# Patient Record
Sex: Male | Born: 1950 | Race: Black or African American | Hispanic: No | Marital: Married | Smoking: Former smoker
Health system: Southern US, Community
[De-identification: ages and names within clinical notes are randomized; demographics above are authoritative.]

## PROBLEM LIST (undated history)

## (undated) DIAGNOSIS — K56609 Unspecified intestinal obstruction, unspecified as to partial versus complete obstruction: Secondary | ICD-10-CM

## (undated) DIAGNOSIS — C8590 Non-Hodgkin lymphoma, unspecified, unspecified site: Secondary | ICD-10-CM

## (undated) DIAGNOSIS — I714 Abdominal aortic aneurysm, without rupture, unspecified: Secondary | ICD-10-CM

## (undated) DIAGNOSIS — Z992 Dependence on renal dialysis: Secondary | ICD-10-CM

## (undated) DIAGNOSIS — I200 Unstable angina: Secondary | ICD-10-CM

## (undated) DIAGNOSIS — I2510 Atherosclerotic heart disease of native coronary artery without angina pectoris: Secondary | ICD-10-CM

## (undated) DIAGNOSIS — N186 End stage renal disease: Secondary | ICD-10-CM

## (undated) DIAGNOSIS — I10 Essential (primary) hypertension: Secondary | ICD-10-CM

## (undated) DIAGNOSIS — K219 Gastro-esophageal reflux disease without esophagitis: Secondary | ICD-10-CM

## (undated) HISTORY — PX: APPENDECTOMY: SHX54

## (undated) HISTORY — PX: RENAL ARTERY STENT: SHX2321

## (undated) HISTORY — PX: ENDARTERECTOMY: SHX5162

## (undated) HISTORY — PX: ANGIOPLASTY / STENTING FEMORAL: SUR30

## (undated) HISTORY — PX: CAROTID STENT: SHX1301

## (undated) HISTORY — PX: SP AV DIALYSIS SHUNT ACCESS EXISTING *L*: HXRAD383

## (undated) HISTORY — PX: KNEE SURGERY: SHX244

## (undated) HISTORY — PX: HERNIA REPAIR: SHX51

## (undated) HISTORY — PX: ROTATOR CUFF REPAIR: SHX139

## (undated) HISTORY — PX: CORONARY ARTERY BYPASS GRAFT: SHX141

---

## 2016-04-09 DIAGNOSIS — K56609 Unspecified intestinal obstruction, unspecified as to partial versus complete obstruction: Secondary | ICD-10-CM

## 2016-04-09 HISTORY — DX: Unspecified intestinal obstruction, unspecified as to partial versus complete obstruction: K56.609

## 2016-05-04 ENCOUNTER — Inpatient Hospital Stay (HOSPITAL_COMMUNITY)
Admission: AD | Admit: 2016-05-04 | Discharge: 2016-05-07 | DRG: 388 | Disposition: A | Discharge status: Home / Self Care | Payer: Medicare Other | Source: Other Acute Inpatient Hospital | Attending: Internal Medicine | Admitting: Internal Medicine

## 2016-05-04 ENCOUNTER — Inpatient Hospital Stay (HOSPITAL_COMMUNITY): Payer: Medicare Other

## 2016-05-04 ENCOUNTER — Encounter (HOSPITAL_COMMUNITY): Payer: Self-pay | Admitting: Family Medicine

## 2016-05-04 DIAGNOSIS — I2510 Atherosclerotic heart disease of native coronary artery without angina pectoris: Secondary | ICD-10-CM | POA: Diagnosis present

## 2016-05-04 DIAGNOSIS — Z79899 Other long term (current) drug therapy: Secondary | ICD-10-CM

## 2016-05-04 DIAGNOSIS — D649 Anemia, unspecified: Secondary | ICD-10-CM | POA: Diagnosis not present

## 2016-05-04 DIAGNOSIS — I120 Hypertensive chronic kidney disease with stage 5 chronic kidney disease or end stage renal disease: Secondary | ICD-10-CM | POA: Diagnosis present

## 2016-05-04 DIAGNOSIS — Z955 Presence of coronary angioplasty implant and graft: Secondary | ICD-10-CM

## 2016-05-04 DIAGNOSIS — Z8679 Personal history of other diseases of the circulatory system: Secondary | ICD-10-CM | POA: Diagnosis not present

## 2016-05-04 DIAGNOSIS — C8590 Non-Hodgkin lymphoma, unspecified, unspecified site: Secondary | ICD-10-CM | POA: Diagnosis not present

## 2016-05-04 DIAGNOSIS — K922 Gastrointestinal hemorrhage, unspecified: Secondary | ICD-10-CM | POA: Insufficient documentation

## 2016-05-04 DIAGNOSIS — Z66 Do not resuscitate: Secondary | ICD-10-CM | POA: Diagnosis not present

## 2016-05-04 DIAGNOSIS — I25110 Atherosclerotic heart disease of native coronary artery with unstable angina pectoris: Secondary | ICD-10-CM | POA: Diagnosis present

## 2016-05-04 DIAGNOSIS — Z951 Presence of aortocoronary bypass graft: Secondary | ICD-10-CM | POA: Diagnosis not present

## 2016-05-04 DIAGNOSIS — I25709 Atherosclerosis of coronary artery bypass graft(s), unspecified, with unspecified angina pectoris: Secondary | ICD-10-CM | POA: Diagnosis not present

## 2016-05-04 DIAGNOSIS — E860 Dehydration: Secondary | ICD-10-CM | POA: Diagnosis present

## 2016-05-04 DIAGNOSIS — I150 Renovascular hypertension: Secondary | ICD-10-CM | POA: Diagnosis not present

## 2016-05-04 DIAGNOSIS — K219 Gastro-esophageal reflux disease without esophagitis: Secondary | ICD-10-CM | POA: Diagnosis not present

## 2016-05-04 DIAGNOSIS — Z87891 Personal history of nicotine dependence: Secondary | ICD-10-CM

## 2016-05-04 DIAGNOSIS — I10 Essential (primary) hypertension: Secondary | ICD-10-CM | POA: Diagnosis not present

## 2016-05-04 DIAGNOSIS — Z992 Dependence on renal dialysis: Secondary | ICD-10-CM | POA: Diagnosis not present

## 2016-05-04 DIAGNOSIS — R9431 Abnormal electrocardiogram [ECG] [EKG]: Secondary | ICD-10-CM | POA: Diagnosis not present

## 2016-05-04 DIAGNOSIS — E889 Metabolic disorder, unspecified: Secondary | ICD-10-CM | POA: Diagnosis not present

## 2016-05-04 DIAGNOSIS — I25799 Atherosclerosis of other coronary artery bypass graft(s) with unspecified angina pectoris: Secondary | ICD-10-CM | POA: Diagnosis not present

## 2016-05-04 DIAGNOSIS — K56600 Partial intestinal obstruction, unspecified as to cause: Principal | ICD-10-CM | POA: Diagnosis present

## 2016-05-04 DIAGNOSIS — E785 Hyperlipidemia, unspecified: Secondary | ICD-10-CM | POA: Diagnosis not present

## 2016-05-04 DIAGNOSIS — N186 End stage renal disease: Secondary | ICD-10-CM

## 2016-05-04 DIAGNOSIS — I739 Peripheral vascular disease, unspecified: Secondary | ICD-10-CM | POA: Diagnosis not present

## 2016-05-04 DIAGNOSIS — Z9221 Personal history of antineoplastic chemotherapy: Secondary | ICD-10-CM

## 2016-05-04 DIAGNOSIS — K56609 Unspecified intestinal obstruction, unspecified as to partial versus complete obstruction: Secondary | ICD-10-CM | POA: Diagnosis present

## 2016-05-04 HISTORY — DX: Atherosclerotic heart disease of native coronary artery without angina pectoris: I25.10

## 2016-05-04 HISTORY — DX: Essential (primary) hypertension: I10

## 2016-05-04 HISTORY — DX: End stage renal disease: Z99.2

## 2016-05-04 HISTORY — DX: End stage renal disease: N18.6

## 2016-05-04 HISTORY — DX: Non-Hodgkin lymphoma, unspecified, unspecified site: C85.90

## 2016-05-04 HISTORY — DX: Dependence on renal dialysis: Z99.2

## 2016-05-04 HISTORY — DX: Unspecified intestinal obstruction, unspecified as to partial versus complete obstruction: K56.609

## 2016-05-04 HISTORY — DX: Gastro-esophageal reflux disease without esophagitis: K21.9

## 2016-05-04 LAB — COMPREHENSIVE METABOLIC PANEL
ALBUMIN: 3.9 g/dL (ref 3.5–5.0)
ALK PHOS: 102 U/L (ref 38–126)
ALT: 11 U/L — ABNORMAL LOW (ref 17–63)
ANION GAP: 23 — AB (ref 5–15)
AST: 23 U/L (ref 15–41)
BUN: 41 mg/dL — ABNORMAL HIGH (ref 6–20)
CALCIUM: 10 mg/dL (ref 8.9–10.3)
CHLORIDE: 95 mmol/L — AB (ref 101–111)
CO2: 21 mmol/L — AB (ref 22–32)
Creatinine, Ser: 10.59 mg/dL — ABNORMAL HIGH (ref 0.61–1.24)
GFR calc non Af Amer: 4 mL/min — ABNORMAL LOW (ref >60)
GFR, EST AFRICAN AMERICAN: 5 mL/min — AB (ref >60)
GLUCOSE: 95 mg/dL (ref 65–99)
POTASSIUM: 5.3 mmol/L — AB (ref 3.5–5.1)
SODIUM: 139 mmol/L (ref 135–145)
Total Bilirubin: 0.6 mg/dL (ref 0.3–1.2)
Total Protein: 7.3 g/dL (ref 6.5–8.1)

## 2016-05-04 LAB — CBC WITH DIFFERENTIAL/PLATELET
BASOS ABS: 0 10*3/uL (ref 0.0–0.1)
BASOS PCT: 0 %
Eosinophils Absolute: 0 10*3/uL (ref 0.0–0.7)
Eosinophils Relative: 0 %
HEMATOCRIT: 41.9 % (ref 39.0–52.0)
Hemoglobin: 13.6 g/dL (ref 13.0–17.0)
LYMPHS PCT: 20 %
Lymphs Abs: 1.6 10*3/uL (ref 0.7–4.0)
MCH: 33.5 pg (ref 26.0–34.0)
MCHC: 32.5 g/dL (ref 30.0–36.0)
MCV: 103.2 fL — AB (ref 78.0–100.0)
MONO ABS: 0.8 10*3/uL (ref 0.1–1.0)
Monocytes Relative: 10 %
NEUTROS ABS: 5.6 10*3/uL (ref 1.7–7.7)
NEUTROS PCT: 70 %
Platelets: 193 10*3/uL (ref 150–400)
RBC: 4.06 MIL/uL — AB (ref 4.22–5.81)
RDW: 15.7 % — AB (ref 11.5–15.5)
WBC: 8 10*3/uL (ref 4.0–10.5)

## 2016-05-04 LAB — PHOSPHORUS: PHOSPHORUS: 6 mg/dL — AB (ref 2.5–4.6)

## 2016-05-04 LAB — LACTIC ACID, PLASMA
Lactic Acid, Venous: 2.8 mmol/L (ref 0.5–1.9)
Lactic Acid, Venous: 1.6 mmol/L (ref 0.5–1.9)
Lactic Acid, Venous: 1.5 mmol/L (ref 0.5–1.9)

## 2016-05-04 LAB — TROPONIN I
TROPONIN I: 0.05 ng/mL — AB (ref <0.03)
Troponin I: 0.1 ng/mL (ref <0.03)
Troponin I: 0.04 ng/mL (ref <0.03)

## 2016-05-04 LAB — MAGNESIUM: MAGNESIUM: 2.6 mg/dL — AB (ref 1.7–2.4)

## 2016-05-04 LAB — PROTIME-INR
INR: 1.16
Prothrombin Time: 14.8 seconds (ref 11.4–15.2)

## 2016-05-04 MED ORDER — NITROGLYCERIN 0.4 MG SL SUBL
SUBLINGUAL_TABLET | SUBLINGUAL | Status: AC
  Filled 2016-05-04: qty 1

## 2016-05-04 MED ORDER — PROMETHAZINE HCL 25 MG/ML IJ SOLN: 12.5000 mg | Freq: Four times a day (QID) | INTRAMUSCULAR | Status: DC | PRN

## 2016-05-04 MED ORDER — SODIUM CHLORIDE 0.9 % IV SOLN
INTRAVENOUS | Status: DC
  Administered 2016-05-04: 14:00:00 via INTRAVENOUS

## 2016-05-04 MED ORDER — METOPROLOL TARTRATE 5 MG/5ML IV SOLN: 5.0000 mg | Freq: Three times a day (TID) | INTRAVENOUS | Status: DC | PRN

## 2016-05-04 MED ORDER — FENTANYL CITRATE (PF) 100 MCG/2ML IJ SOLN
50.0000 ug | INTRAMUSCULAR | Status: DC | PRN
  Administered 2016-05-04 – 2016-05-06 (×5): 50 ug via INTRAVENOUS
  Filled 2016-05-04 (×5): qty 2

## 2016-05-04 MED ORDER — HEPARIN SODIUM (PORCINE) 5000 UNIT/ML IJ SOLN
5000.0000 [IU] | Freq: Three times a day (TID) | INTRAMUSCULAR | Status: DC
  Administered 2016-05-04: 5000 [IU] via SUBCUTANEOUS
  Filled 2016-05-04: qty 1

## 2016-05-04 MED ORDER — SODIUM CHLORIDE 0.9 % IV BOLUS (SEPSIS)
1000.0000 mL | Freq: Once | INTRAVENOUS | Status: AC
Start: 2016-05-04 — End: 2016-05-04
  Administered 2016-05-04: 1000 mL via INTRAVENOUS

## 2016-05-04 MED ORDER — NITROGLYCERIN 0.4 MG SL SUBL
0.4000 mg | SUBLINGUAL_TABLET | SUBLINGUAL | Status: DC | PRN
  Administered 2016-05-04 – 2016-05-05 (×3): 0.4 mg via SUBLINGUAL
  Filled 2016-05-04 (×2): qty 1

## 2016-05-04 MED ORDER — SEVELAMER CARBONATE 2.4 G PO PACK
2.4000 g | PACK | Freq: Three times a day (TID) | ORAL | Status: DC
  Administered 2016-05-06 – 2016-05-07 (×3): 2.4 g via ORAL
  Filled 2016-05-04 (×9): qty 1

## 2016-05-04 MED ORDER — ONDANSETRON HCL 4 MG/2ML IJ SOLN
4.0000 mg | Freq: Four times a day (QID) | INTRAMUSCULAR | Status: DC | PRN
  Filled 2016-05-04: qty 2

## 2016-05-04 MED ORDER — LORAZEPAM 2 MG/ML IJ SOLN
0.5000 mg | Freq: Every evening | INTRAMUSCULAR | Status: DC | PRN
  Administered 2016-05-05: 0.5 mg via INTRAVENOUS
  Filled 2016-05-04: qty 1

## 2016-05-04 MED ORDER — PHENOL 1.4 % MT LIQD
1.0000 | OROMUCOSAL | Status: DC | PRN
  Filled 2016-05-04: qty 177

## 2016-05-04 MED ORDER — CINACALCET HCL 30 MG PO TABS
60.0000 mg | ORAL_TABLET | Freq: Every day | ORAL | Status: DC
  Administered 2016-05-05 – 2016-05-06 (×2): 60 mg via ORAL
  Filled 2016-05-04 (×2): qty 2

## 2016-05-04 MED ORDER — HYDRALAZINE HCL 20 MG/ML IJ SOLN: 10.0000 mg | Freq: Three times a day (TID) | INTRAMUSCULAR | Status: DC | PRN

## 2016-05-04 NOTE — Plan of Care (Signed)
65 year old male with known history of ESRD, CAD status post CABG and abdominal aortic aneurysm presents with abdominal pain nausea and vomiting. Patient is found to have high-grade small bowel obstruction in the CAT scan. Dr. Ninfa Linden on call general surgeon was consulted and agreed to see patient in consult. Patient is being transferred to [facility] for further management since there was no beds at [place].

## 2016-05-04 NOTE — Progress Notes (Signed)
Pt c/o  left upper chest pressure. States he takes Nitro at home. VS as follows: 98.3,92,20,171/89 O2sats at 96%RA. Text page on call MD A. Hijazi with new orders received. @ 2209 Pt gave 1 nitro SL after 62mins pt stated chest pressure eased off. Will cont to monitor pt.

## 2016-05-04 NOTE — Progress Notes (Signed)
Pt admitted to [phone] via stretcher by EMS from [place].  Pt AAO X4.  Pt on RA.  Pt has NGT to lt nare. Pt has 20G to Rt AC SL.  Pt has no complaints at the moment.  Will continue to monitor.

## 2016-05-04 NOTE — Progress Notes (Signed)
CRITICAL VALUE ALERT  Critical value received:  Lactic Acid 2.8 Date of notification: 05/04/16 Time of notification:  1713 Critical value read back: yes Nurse who received alert:  Jeralene Peters, RN MD notified (1st page):  Dr. Aggie Moats Time of first page: 1714 MD notified (2nd page):  Time of second page:  Responding MD:  Dr. Aggie Moats Time MD responded:  [phone]

## 2016-05-04 NOTE — Progress Notes (Signed)
CRITICAL VALUE ALERT  Critical value received: Triponin 0.10 Date of notification:  05/04/16 Time of notification:  1814 Critical value read back:yes Nurse who received alert: Jeralene Peters MD notified (1st page):  Dr. Aggie Moats Time of first page:  44  MD notified (2nd page):N/A  Time of second page:N/A  Responding MD:  Dr. Aggie Moats Time MD responded: [phone]

## 2016-05-04 NOTE — Consult Note (Signed)
Reason for Consult:  SBO Referring Physician: Dr. Billey Gosling is an 65 y.o. male.  HPI: Patient presented early this a.m. with onset of abdominal pain started around 4:00 yesterday afternoon. He has history of end-stage renal disease. CT scan was obtained study reveals cardiomegaly the liver was unremarkable,. 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 No hydronephrosis. Stomach shows high-grade distal small bowel obstruction collapsed ileal loops. Mild sigmoid diverticulosis. 3.2 cm fluid distended small bowel no mucosal thickening no findings of acute appendicitis 5.1 cm abdominal aortic aneurysm with bilateral iliac stents. Impression was a high-grade distal small bowel obstruction with collapsed ileal loops. He has no prior history of small bowel obstruction. His wife notes he had a paralytic ileus after his coronary bypass grafting in 2010. He's had no prior history of small bowel obstruction. His prior surgeries include an appendectomy and a PD catheter for dialysis. This was placed in 2007 and removed in 2010 or 11. He does have some issues with constipation which he relates to his medications.  WBC was 9.4, hemoglobin 14.7 hematocrit 44.8. Weight was 236,000. Sodium 138 potassium 4.8 chloride 93 CO2 28 BUN 32 glucose 131, creatinine 9.16, lipase 47  After evaluation and [place] operations were made for him to be transferred to medicine service [facility]. An NG tube was placed. He complains of some pain and some bleeding with placement of the NG tube. He does note his stomach distention has resolved, as has his abdominal pain that he presented with. His last BM was last night. He did note some constipation that time. He's had some loose stool here at [facility] since his arrival. NG tube remains in place and appears to be in  position. He notes he feels much better than when he presented to [facility] around 3 AM this morning.    Past Medical History:  Diagnosis Date  . CAD (coronary artery disease)  Hx of stents/CABG  05/04/2016  . ESRD on dialysis ([place])  05/04/2016  . HTN (hypertension) 05/04/2016   Right carotid stent, and bilateral iliac stents    Hx of tobacco use   . Lymphoma ([place])  Neck 1995 rx with chemotherapy 05/04/2016    Past Surgical History:  Procedure Laterality Date  . APPENDECTOMY    . CORONARY ARTERY BYPASS GRAFT    . HERNIA REPAIR    . KNEE SURGERY    . SP AV DIALYSIS SHUNT ACCESS EXISTING *left upper extremity*    Tobacco: None 10 years, he smoked for 37 years up to 2 packs per day. EtOH: None. Drugs: None. Retired Quarry manager. He is married and his wife and family are here with him.  Family History  Problem Relation Age of Onset  . Heart attack Neg Hx     Social History:  reports that he has never smoked. He has never used smokeless tobacco. His alcohol and drug histories are not on file.  Allergies: No Known Allergies  Prior to Admission medications   Not on File  Home medication list from [facility] [place]: Nitroglycerin 0.4 mg grams sublingual when necessary Zantac 150 mg daily Isosorbide mononitrate 60 mg every 24 hours Coronary 12.5 mg daily Mag oxide 400 mg daily with meals Colace 200 mg daily Renvela 2.4 g oral powder pack with meals  daily MVI vitamin with iron Sensipar 60 mg daily Amlodipine 5 mg  daily Pravastatin 30 mg at bedtime Simethicone 80 mg daily when necessary Flonase 50 g per spray daily use  Scheduled: . heparin  5,000 Units Subcutaneous Q8H   Continuous: . sodium chloride     [id number] (SUBLIMAZE) injection, hydrALAZINE, LORazepam, metoprolol, ondansetron (ZOFRAN) IV **OR** promethazine Anti-infectives    None      No results found for this or any previous visit (from the past 48 hour(s)).  Dg Abd Portable  1v  Result Date: 05/04/2016 CLINICAL DATA:  Abdominal pain BloatedUnable to have bowel movement, symptoms since last nightPatient did have bowel movent an hour agoDialysis patientX smoker 10 yearsHypertensionBypass surgery 10 years ago EXAM: PORTABLE ABDOMEN - 1 VIEW COMPARISON:  CT, 01/14/2009 FINDINGS: There are mildly distended loops of small bowel most evident in the left central abdomen. Some air and stool is seen within a nondistended colon. There are extensive vascular calcifications. Bilateral iliac artery stents are noted. Nasal/orogastric tube passes below the diaphragm well into the stomach. Soft tissues are otherwise unremarkable. IMPRESSION: 1. Small bowel dilation with no colonic distention. This is consistent with a partial small bowel obstruction. Electronically Signed   By: Lajean Manes M.D.   On: 05/04/2016 13:38    Review of Systems  Constitutional: Negative.   HENT: Negative.   Eyes: Negative.   Respiratory: Negative.   Cardiovascular: Negative.   Gastrointestinal: Positive for abdominal pain, constipation, diarrhea, nausea and vomiting.  Musculoskeletal:       LUE FISTULA  Skin: Negative.   Neurological: Negative.   Endo/Heme/Allergies: Negative.   Psychiatric/Behavioral: Negative.    Blood pressure (!) 160/77, pulse 81, temperature 98.8 F (37.1 C), temperature source Oral, resp. rate 18, SpO2 100 %. Physical Exam  Constitutional: He is oriented to person, place, and time. He appears well-developed and well-nourished. No distress.  HENT:  Head: Normocephalic.  Ng IN PLACE WITH SOME blood around his nose   Eyes: Right eye exhibits no discharge. Left eye exhibits no discharge. No scleral icterus.  Neck: Normal range of motion. Neck supple. No JVD present. No tracheal deviation present. No thyromegaly present.  Cardiovascular: Normal rate, regular rhythm, normal heart sounds and intact distal pulses.   Respiratory: Effort normal and breath sounds normal. No  respiratory distress. He has no wheezes. He has no rales. He exhibits no tenderness.  GI: Soft. Bowel sounds are normal. He exhibits no distension and no mass. There is no tenderness. There is no rebound and no guarding.  Musculoskeletal: He exhibits no edema or tenderness.  LUE FISTULA  Lymphadenopathy:    He has no cervical adenopathy.  Neurological: He is alert and oriented to person, place, and time. No cranial nerve deficit.  Skin: Skin is warm and dry. No rash noted. He is not diaphoretic. No erythema. No pallor.  Psychiatric: He has a normal mood and affect. His behavior is normal. Judgment and thought content normal.    Assessment/Plan: Small bowel obstruction History of appendectomy/PD catheter/postop paralytic ileus after CABG End-stage renal disease on hemodialysis Monday Wednesday Friday CAD status post CABG 2010 Peripheral vascular disease with right carotid and bilateral iliac stents Hypertension [place] tobacco use History of lymphoma, with chemotherapy 1995  Plan: Right now he is markedly improved from his presentation earlier this AM in [place]. Stomach is decompressed he's having some flatus and a small amount of stool since arrival. Plan to get a two-view x-ray now and repeat in the a.m. continue NG decompression. I would recommend replacing his medications with IV form tonight, especially  the Coreg. We will follow with you.  Dasja Brase 05/04/2016, 2:21 PM

## 2016-05-04 NOTE — Consult Note (Signed)
[facility] Renal Consultation Note    Indication for Consultation:  Management of ESRD/hemodialysis; anemia, hypertension/volume and secondary hyperparathyroidism  HPI: Jeremy Kirby is a 65 y.o. male with ESRD on hemodialysis MWF. Past medical history is significant for HTN CAD s/p CABG, h/o lymphoma. S/p appendectomy Jeremy Kirby is seen at bedside with his wife. She reports profuse nausea/vomiting and abdominal pain starting yesterday afternoon. They presented to the ED in [place], [place] where they reside and after initial evaluation he was diagnosed with small bowel obstruction. They were transferred to [facility] for admission due to a lack of beds in the area. On admission NG tube was placed. Abdominal xray reveals small bowel dilation.  Labs on admission WBC 8.0 Hgb 13.6 Lactic acid 2.8 Currently Jeremy Kirby reports that his symptoms have improved since arrival. He has had no further vomiting, but does endorse some nausea. Abdominal pain has improved. He denies fever, chest pain, or dyspnea. His wife denies any recent bowel surgeries. He s/p appendectomy and prior PD cath about 6-7 years ago.    Past Medical History:  Diagnosis Date  . CAD (coronary artery disease) 05/04/2016  . ESRD on dialysis ([place]) 05/04/2016  . GERD (gastroesophageal reflux disease)   . HTN (hypertension) 05/04/2016  . Lymphoma ([place]) 05/04/2016  . SBO (small bowel obstruction) 04/2016   Past Surgical History:  Procedure Laterality Date  . APPENDECTOMY    . CORONARY ARTERY BYPASS GRAFT    . HERNIA REPAIR    . KNEE SURGERY    . SP AV DIALYSIS SHUNT ACCESS EXISTING *L*     Family History  Problem Relation Age of Onset  . Heart attack Neg Hx    Social History:  reports that he has quit smoking. He has never used smokeless tobacco. He reports that he does not drink alcohol or use drugs. No Known Allergies Prior to Admission medications   Medication Sig Start Date End Date Taking? Authorizing Provider   acetaminophen (TYLENOL) 500 MG tablet Take 500 mg by mouth daily as needed (pain).   Yes Historical Provider, MD  amLODipine (NORVASC) 5 MG tablet Take 5 mg by mouth at bedtime. Hold for systolic BP [id number]  Yes Historical Provider, MD  aspirin EC 81 MG tablet Take 81 mg by mouth daily.   Yes Historical Provider, MD  B Complex-C-Zn-Folic Acid (DIALYVITE Q000111Q WITH ZINC) 0.8 MG TABS Take 1 tablet by mouth daily with supper.   Yes Historical Provider, MD  carvedilol (COREG) 12.5 MG tablet Take 3.125 mg by mouth 2 (two) times daily. 1/4 tablet 03/15/16  Yes Historical Provider, MD  cinacalcet (SENSIPAR) 60 MG tablet Take 60 mg by mouth daily with supper.   Yes Historical Provider, MD  docusate sodium (COLACE) 100 MG capsule Take 100 mg by mouth 2 (two) times daily.   Yes Historical Provider, MD  fluticasone (FLONASE) 50 MCG/ACT nasal spray Place 2 sprays into both nostrils daily as needed (congestion).  08/24/13  Yes Historical Provider, MD  hydrOXYzine (ATARAX/VISTARIL) 25 MG tablet Take 25 mg by mouth every 8 (eight) hours as needed for itching.  03/17/16  Yes Historical Provider, MD  isosorbide mononitrate (IMDUR) 60 MG 24 hr tablet Take 60 mg by mouth daily. 04/06/16  Yes Historical Provider, MD  magnesium oxide (MAG-OX) 400 MG tablet Take 400 mg by mouth 3 (three) times daily with meals. 03/02/11  Yes Historical Provider, MD  nitroGLYCERIN (NITROSTAT) 0.4 MG SL tablet Place 0.4 mg under the tongue every 5 (five)  minutes as needed for chest pain.  03/26/16  Yes Historical Provider, MD  polyethylene glycol (MIRALAX / GLYCOLAX) packet Take 17 g by mouth daily as needed (constipation). Mix in 8 oz of liquid and drink   Yes Historical Provider, MD  pravastatin (PRAVACHOL) 40 MG tablet Take 40 mg by mouth at bedtime. 04/06/16  Yes Historical Provider, MD  ranitidine (ZANTAC) 150 MG tablet Take 150 mg by mouth See admin instructions. Take 1 tablet (150 mg) by mouth daily before breakfast, may also take a 2nd  tablet later in the day as needed for acid reflux   Yes Historical Provider, MD  sevelamer carbonate (RENVELA) 2.4 g PACK Take 2.4 g by mouth 3 (three) times daily with meals. Mix 1 packet in 2 oz of water and drink   Yes Historical Provider, MD  simethicone (MYLICON) 80 MG chewable tablet Chew 80-160 mg by mouth every 6 (six) hours as needed for flatulence. Max 6 tablets in one day.   Yes Historical Provider, MD   Current Facility-Administered Medications  Medication Dose Route Frequency Provider Last Rate Last Dose  . 0.9 %  sodium chloride infusion   Intravenous Continuous Elwin Mocha, MD 125 mL/hr at 05/04/16 1426    . fentaNYL (SUBLIMAZE) injection 50 mcg  50 mcg Intravenous Q4H PRN Elwin Mocha, MD   50 mcg at 05/04/16 1424  . heparin injection 5,000 Units  5,000 Units Subcutaneous Q8H Elwin Mocha, MD   5,000 Units at 05/04/16 1426  . hydrALAZINE (APRESOLINE) injection 10 mg  10 mg Intravenous Q8H PRN Elwin Mocha, MD      . LORazepam (ATIVAN) injection 0.5 mg  0.5 mg Intravenous QHS PRN Elwin Mocha, MD      . metoprolol (LOPRESSOR) injection 5 mg  5 mg Intravenous Q8H PRN Elwin Mocha, MD      . ondansetron [facility]) injection 4 mg  4 mg Intravenous Q6H PRN Samella Parr, NP       Or  . promethazine (PHENERGAN) injection 12.5 mg  12.5 mg Intravenous Q6H PRN Samella Parr, NP       Labs: Basic Metabolic Panel: No results for input(s): NA, K, CL, CO2, GLUCOSE, BUN, CREATININE, CALCIUM, PHOS in the last 168 hours.  Invalid input(s): ALB Liver Function Tests: No results for input(s): AST, ALT, ALKPHOS, BILITOT, PROT, ALBUMIN in the last 168 hours. No results for input(s): LIPASE, AMYLASE in the last 168 hours. No results for input(s): AMMONIA in the last 168 hours. CBC:  Recent Labs Lab 05/04/16 1607  WBC 8.0  NEUTROABS 5.6  HGB 13.6  HCT 41.9  MCV 103.2*  PLT 193   Cardiac Enzymes: No results for input(s): CKTOTAL, CKMB, CKMBINDEX, TROPONINI in the  last 168 hours. CBG: No results for input(s): GLUCAP in the last 168 hours. Iron Studies: No results for input(s): IRON, TIBC, TRANSFERRIN, FERRITIN in the last 72 hours. Studies/Results: Dg Abd Portable 1v  Result Date: 05/04/2016 CLINICAL DATA:  Abdominal pain BloatedUnable to have bowel movement, symptoms since last nightPatient did have bowel movent an hour agoDialysis patientX smoker 10 yearsHypertensionBypass surgery 10 years ago EXAM: PORTABLE ABDOMEN - 1 VIEW COMPARISON:  CT, 01/14/2009 FINDINGS: There are mildly distended loops of small bowel most evident in the left central abdomen. Some air and stool is seen within a nondistended colon. There are extensive vascular calcifications. Bilateral iliac artery stents are noted. Nasal/orogastric tube passes below the diaphragm well into the stomach. Soft tissues are  otherwise unremarkable. IMPRESSION: 1. Small bowel dilation with no colonic distention. This is consistent with a partial small bowel obstruction. Electronically Signed   By: Lajean Manes M.D.   On: 05/04/2016 13:38    ROS: As per HPI otherwise negative.  Physical Exam: Vitals:   05/04/16 1152  BP: (!) 160/77  Pulse: 81  Resp: 18  Temp: 98.8 F (37.1 C)  TempSrc: Oral  SpO2: 100%     General: WDWN AAM resting NAD Head: NCAT sclera not icteric MMM, uncomfortable with NG tube Neck: Supple.  Lungs: CTA bilaterally without wheezes, rales, or rhonchi. Breathing is unlabored. Heart: RRR with S1 S2.  Abdomen: soft NT + BS no localized tenderness  Lower extremities:without edema or ischemic changes, no open wounds  Neuro: A & O  X 3. Moves all extremities spontaneously. Psych:  Responds to questions appropriately with a normal affect. Dialysis Access:L forearm AVF +thrill/bruit   Dialysis Orders[facility] [place] [place] M[place], 3hr 32min  Assessment/Plan: 1.  Small bowel obstruction - per primary  2.  ESRD -  Cont on MWF schedule - for HD tomorrow  3.  Hypertension/volume  -  BP elevated/ cont home meds - should improve with HD tomorrow  4.  Anemia  - Hgb 13.6 - follow no ESA ordered  5.  Metabolic bone disease  - Follow Ca/P Renal function panel in am -on Renvela/Sensipar - resume with diet  6.  Nutrition - NPO currently add renal vitamin when diet resumes   Lynnda Child PA-C [facility] Pager [phone] 05/04/2016, 4:57 PM  I have seen and examined this patient and agree with plan per Larina Earthly.  65yo BM with ESRD on HD MWF in [place] and admitted for SBO.   Last HD was Wed.  Was on PD for 3 yrs and ? If that is what predisposed him to SBO.  He is feeling a little better.  Will plan HD in AM.  Will need to call his unit in AM and get info.  Decrease IV rate to 50cc/hr.   Ryeleigh Santore T,MD 05/04/2016 5:59 PM

## 2016-05-04 NOTE — H&P (Addendum)
[facility] History and Physical  Cayce Hull [id number] DOB: 1951-03-09 DOA: 05/04/2016  Referring physician:  PCP: No primary care provider on file.   Chief Complaint: "I started throwing up."  HPI: Jeremy Kirby is a 65 y.o. male   past medical history of CABG, cardiac stents, unstable angina, lymphoma status post excision, AAA, chronic kidney disease, hyperlipidemia, hypertension who presents with nausea and vomiting. Patient is a transfer from [place]. He presented to [facility] after lunch, ate a hoagie. He ate breakfast normally. He presented with profuse nausea and vomiting. . Patient denies any suspicious ingestions. Patient denies any abdominal trauma. Patient went to the emergency room for evaluation.  ED Course:Patient was given Zofran and IV fluids. These help his symptoms. CT abdomen pelvis done that showed a high-grade small bowel obstruction and distal small bowel. The facility transfer the patient to [name] due to lack of beds.  Transferrd due to lack of beds Na 138, K 4.8, CO2 28, Ca 10.8, BUN 32, glu 131, bili 0.88, cr 9.16 WBC 9.4, hgb 14.7, plt 236 EKG - t waves inverte din v4-v6 and st deprssion in v4-v6  Review of Systems:  As per HPI otherwise 10 point review of systems negative.    Past Medical History:  Diagnosis Date  . CAD (coronary artery disease) 05/04/2016  . ESRD on dialysis ([place]) 05/04/2016  . GERD (gastroesophageal reflux disease)   . HTN (hypertension) 05/04/2016  . Lymphoma ([place]) 05/04/2016  . SBO (small bowel obstruction) 04/2016   Past Surgical History:  Procedure Laterality Date  . APPENDECTOMY    . CAROTID STENT    . CORONARY ARTERY BYPASS GRAFT    . ENDARTERECTOMY    . HERNIA REPAIR    . KNEE SURGERY    . ROTATOR CUFF REPAIR    . SP AV DIALYSIS SHUNT ACCESS EXISTING *L*     Social History:  reports that he has quit smoking. He has never used smokeless tobacco. He reports that he does not drink alcohol or use drugs.  No  Known Allergies  Family History  Problem Relation Age of Onset  . Heart attack Neg Hx      Prior to Admission medications   Not on File   Physical Exam: Vitals:   05/04/16 1152  BP: (!) 160/77  Pulse: 81  Resp: 18  Temp: 98.8 F (37.1 C)  TempSrc: Oral  SpO2: 100%    Wt Readings from Last 3 Encounters:  No data found for Wt    General:  Appears calm and uncomfortable Eyes:  PERRL, EOMI, normal lids, iris ENT:  grossly normal hearing, lips & tongue; NG tube present Neck:  no LAD, masses or thyromegaly Cardiovascular:  RRR, no m/r/g. No LE edema.  Respiratory:  CTA bilaterally, no w/r/r. Normal respiratory effort. Abdomen:  soft, LLQ tenderness, ND, BS+ but not regular; Reducible umbilical hernia Skin:  no rash or induration seen on limited exam Musculoskeletal:  grossly normal tone BUE/BLE Psychiatric:  grossly normal mood and affect, speech fluent and appropriate Neurologic:  CN 2-12 grossly intact, moves all extremities in coordinated fashion. alert and oriented 3 .          Labs on Admission:  Basic Metabolic Panel: No results for input(s): NA, K, CL, CO2, GLUCOSE, BUN, CREATININE, CALCIUM, MG, PHOS in the last 168 hours. Liver Function Tests: No results for input(s): AST, ALT, ALKPHOS, BILITOT, PROT, ALBUMIN in the last 168 hours. No results for input(s): LIPASE, AMYLASE in the last 168  hours. No results for input(s): AMMONIA in the last 168 hours. CBC:  Recent Labs Lab 05/04/16 1607  WBC 8.0  NEUTROABS 5.6  HGB 13.6  HCT 41.9  MCV 103.2*  PLT 193   Cardiac Enzymes: No results for input(s): CKTOTAL, CKMB, CKMBINDEX, TROPONINI in the last 168 hours.  BNP (last 3 results) No results for input(s): BNP in the last 8760 hours.  ProBNP (last 3 results) No results for input(s): PROBNP in the last 8760 hours.   Creatinine clearance cannot be calculated (Unknown ideal weight.)  CBG: No results for input(s): GLUCAP in the last 168  hours.  Radiological Exams on Admission: Dg Abd Portable 1v  Result Date: 05/04/2016 CLINICAL DATA:  Abdominal pain BloatedUnable to have bowel movement, symptoms since last nightPatient did have bowel movent an hour agoDialysis patientX smoker 10 yearsHypertensionBypass surgery 10 years ago EXAM: PORTABLE ABDOMEN - 1 VIEW COMPARISON:  CT, 01/14/2009 FINDINGS: There are mildly distended loops of small bowel most evident in the left central abdomen. Some air and stool is seen within a nondistended colon. There are extensive vascular calcifications. Bilateral iliac artery stents are noted. Nasal/orogastric tube passes below the diaphragm well into the stomach. Soft tissues are otherwise unremarkable. IMPRESSION: 1. Small bowel dilation with no colonic distention. This is consistent with a partial small bowel obstruction. Electronically Signed   By: Lajean Manes M.D.   On: 05/04/2016 13:38    EKG: Independently reviewed. From OSH.VR 82, PR 190, QRS 114, QTC 504; NSr with PVCs, flipped t waves in lat leads, ST depression in leads v3-v6  Assessment/Plan Principal Problem:   Bowel obstruction Active Problems:   ESRD on dialysis (HCC)   HTN (hypertension)   Lymphoma (HCC)   CAD (coronary artery disease)   SBO (small bowel obstruction)   Nonspecific abnormal electrocardiogram (ECG) (EKG)   GI bleeding   Bowel obstruction Surgery consulted Elevated lactic acid likely due to bowel obstruction or dehydration Giving IV fluids bolus ordered X-ray done and how shows partial bowel obstruction, patient states he has had passage of flatus and stool When necessary fentanyl Nothing by mouth Mg and Phos pending  End-stage renal disease Nephrology consultation  Hypertension When necessary hydralazine 10 mg IV as needed for severe blood pressure Lopressor as second line agent  Lymphoma, inactive for 11yrs Monitor CBC  Abnormal EKG Patient with anterior lateral changes on EKG Serial troponin  ordered  Code Status: DNR/DNI  DVT Prophylaxis: SCD Family Communication: Family at bedside and spoke to wife by phone Disposition Plan: Pending Improvement    Elwin Mocha, MD Family Medicine [facility] [email or web address] Password [email or web address]

## 2016-05-05 ENCOUNTER — Inpatient Hospital Stay (HOSPITAL_COMMUNITY): Payer: Medicare Other

## 2016-05-05 DIAGNOSIS — K56600 Partial intestinal obstruction, unspecified as to cause: Principal | ICD-10-CM

## 2016-05-05 DIAGNOSIS — I10 Essential (primary) hypertension: Secondary | ICD-10-CM

## 2016-05-05 DIAGNOSIS — I25709 Atherosclerosis of coronary artery bypass graft(s), unspecified, with unspecified angina pectoris: Secondary | ICD-10-CM

## 2016-05-05 DIAGNOSIS — Z992 Dependence on renal dialysis: Secondary | ICD-10-CM

## 2016-05-05 DIAGNOSIS — N186 End stage renal disease: Secondary | ICD-10-CM

## 2016-05-05 DIAGNOSIS — I150 Renovascular hypertension: Secondary | ICD-10-CM

## 2016-05-05 LAB — RENAL FUNCTION PANEL
Albumin: 3.3 g/dL — ABNORMAL LOW (ref 3.5–5.0)
Anion gap: 17 — ABNORMAL HIGH (ref 5–15)
BUN: 49 mg/dL — AB (ref 6–20)
CALCIUM: 9.7 mg/dL (ref 8.9–10.3)
CHLORIDE: 97 mmol/L — AB (ref 101–111)
CO2: 27 mmol/L (ref 22–32)
CREATININE: 11.81 mg/dL — AB (ref 0.61–1.24)
GFR calc Af Amer: 5 mL/min — ABNORMAL LOW (ref >60)
GFR calc non Af Amer: 4 mL/min — ABNORMAL LOW (ref >60)
GLUCOSE: 100 mg/dL — AB (ref 65–99)
Phosphorus: 6.1 mg/dL — ABNORMAL HIGH (ref 2.5–4.6)
Potassium: 5 mmol/L (ref 3.5–5.1)
SODIUM: 141 mmol/L (ref 135–145)

## 2016-05-05 LAB — TROPONIN I
TROPONIN I: 0.06 ng/mL — AB (ref <0.03)
TROPONIN I: 0.28 ng/mL — AB (ref <0.03)
Troponin I: 0.1 ng/mL (ref <0.03)

## 2016-05-05 LAB — CBC
HCT: 37.5 % — ABNORMAL LOW (ref 39.0–52.0)
Hemoglobin: 12.1 g/dL — ABNORMAL LOW (ref 13.0–17.0)
MCH: 32.6 pg (ref 26.0–34.0)
MCHC: 32.3 g/dL (ref 30.0–36.0)
MCV: 101.1 fL — AB (ref 78.0–100.0)
PLATELETS: 207 10*3/uL (ref 150–400)
RBC: 3.71 MIL/uL — AB (ref 4.22–5.81)
RDW: 15.4 % (ref 11.5–15.5)
WBC: 6.7 10*3/uL (ref 4.0–10.5)

## 2016-05-05 MED ORDER — NITROGLYCERIN 2 % TD OINT
0.5000 [in_us] | TOPICAL_OINTMENT | Freq: Four times a day (QID) | TRANSDERMAL | Status: DC
  Administered 2016-05-05 – 2016-05-06 (×3): 0.5 [in_us] via TOPICAL
  Filled 2016-05-05: qty 30

## 2016-05-05 MED ORDER — SODIUM CHLORIDE 0.9 % IV SOLN: INTRAVENOUS | Status: DC

## 2016-05-05 MED ORDER — METOPROLOL TARTRATE 5 MG/5ML IV SOLN: 5.0000 mg | Freq: Four times a day (QID) | INTRAVENOUS | Status: DC

## 2016-05-05 MED ORDER — METOPROLOL TARTRATE 5 MG/5ML IV SOLN
5.0000 mg | Freq: Four times a day (QID) | INTRAVENOUS | Status: DC
  Administered 2016-05-06 (×2): 5 mg via INTRAVENOUS
  Filled 2016-05-05 (×2): qty 5

## 2016-05-05 MED ORDER — FAMOTIDINE IN NACL 20-0.9 MG/50ML-% IV SOLN
20.0000 mg | Freq: Two times a day (BID) | INTRAVENOUS | Status: DC
  Administered 2016-05-05 – 2016-05-06 (×3): 20 mg via INTRAVENOUS
  Filled 2016-05-05 (×4): qty 50

## 2016-05-05 NOTE — Progress Notes (Signed)
  Film shows: NG tube is in the proximal stomach. Improving small bowel dilatation. No free air organomegaly. Diffuse vascular calcifications noted. Order for clamping NG in place, I have ordered clear liquids per Dr. Darrel Hoover note.

## 2016-05-05 NOTE — Progress Notes (Signed)
PROGRESS NOTE    Jeremy Kirby  [id number] DOB: 04-26-1951 DOA: 05/04/2016 PCP: No primary care provider on file.   Brief Narrative: Jeremy Kirby is a 65 y.o. male past medical history of CABG, cardiac stents, unstable angina, lymphoma status post excision, AAA, chronic kidney disease, hyperlipidemia, hypertension who presents with nausea and vomiting. Patient is a transfer from [place]. He presented to [facility] after lunch, ate a hoagie. He ate breakfast normally. He presented with profuse nausea and vomiting. . Patient denies any suspicious ingestions. Patient denies any abdominal trauma. Patient went to the emergency room for evaluation. CT abdomen pelvis done that showed a high-grade small bowel obstruction and distal small bowel. The facility transfer the patient to [name] due to lack of beds.  Assessment & Plan:   Principal Problem:   Bowel obstruction Active Problems:   ESRD on dialysis (HCC)   HTN (hypertension)   Lymphoma (HCC)   CAD (coronary artery disease)   SBO (small bowel obstruction)   Nonspecific abnormal electrocardiogram (ECG) (EKG)   1-Bowel obstruction Surgery consulted and following.  Elevated lactic acid likely due to bowel obstruction or dehydration PRN fentanyl X ray with improved Small bowel dilation.  IV Pepcid.   2-History of unstable angina. Abnormal EKG History of CABG 2010, H/O blockage 2014 Patient with anterior lateral changes on EKG. Will request record from PCP.  Serial troponin mildly elevated, flat.  Check ECHO.  He  is chest pain free this am. Had some chest pressure yesterday improved with nitroglycerin.  Coreg change to IV metoprolol.  Review EKG from [facility].  patient with pronounce T wave inversion lead V 4 and V 5 on EKG from last night. I will consult cardiology for further evaluation.    3-End-stage renal disease Nephrology consultation For HD today.   4-Hypertension  hydralazine 10 mg IV as needed for severe blood  pressure Lopressor as second line agent  5-Lymphoma, inactive for 69yrs Monitor CBC   DVT prophylaxis: scd.  Code Status: Full code.  Family Communication: care discussed with patient  Disposition Plan: remain inpatient   Consultants:   Nephrology  surgery   Procedures:   HD   Antimicrobials:  none  Subjective: He is chest pain free this morning. He had chest pain last night which improved after he received nitroglycerin. EKG with pronounce T wave inversion V 4 and V 5.  He relates abdominal pain has improved.    Objective: Vitals:   05/04/16 2158 05/04/16 2340 05/05/16 0342 05/05/16 0505  BP: (!) 171/89 (!) 149/77 (!) 152/88 (!) 163/83  Pulse: 92 90 88 89  Resp: 20 20 18 17   Temp: 98.3 F (36.8 C)  98.6 F (37 C) 98.4 F (36.9 C)  TempSrc: Oral  Oral Oral  SpO2: 96% 95% 96% 96%    Intake/Output Summary (Last 24 hours) at 05/05/16 [id number] Last data filed at 05/05/16 0600  Gross per 24 hour  Intake          1064.58 ml  Output              500 ml  Net           564.58 ml   There were no vitals filed for this visit.  Examination:  General exam: Appears calm and comfortable  Respiratory system: Clear to auscultation. Respiratory effort normal. Cardiovascular system: S1 & S2 heard, RRR. No JVD, murmurs, rubs, gallops or clicks. No pedal edema. Gastrointestinal system: Abdomen is nondistended, soft and nontender. No organomegaly or  masses felt. Normal bowel sounds heard. Central nervous system: Alert and oriented. No focal neurological deficits. Extremities: Symmetric 5 x 5 power. Skin: No rashes, lesions or ulcers Psychiatry: Judgement and insight appear normal. Mood & affect appropriate.     Data Reviewed: I have personally reviewed following labs and imaging studies  CBC:  Recent Labs Lab 05/04/16 1607 05/05/16 0405  WBC 8.0 6.7  NEUTROABS 5.6  --   HGB 13.6 12.1*  HCT 41.9 37.5*  MCV 103.2* 101.1*  PLT 193 [id number]   Basic Metabolic  Panel:  Recent Labs Lab 05/04/16 1607 05/05/16 0405  NA 139 141  K 5.3* 5.0  CL 95* 97*  CO2 21* 27  GLUCOSE 95 100*  BUN 41* 49*  CREATININE 10.59* 11.81*  CALCIUM 10.0 9.7  MG 2.6*  --   PHOS 6.0* 6.1*   GFR: CrCl cannot be calculated (Unknown ideal weight.). Liver Function Tests:  Recent Labs Lab 05/04/16 1607 05/05/16 0405  AST 23  --   ALT 11*  --   ALKPHOS 102  --   BILITOT 0.6  --   PROT 7.3  --   ALBUMIN 3.9 3.3*   No results for input(s): LIPASE, AMYLASE in the last 168 hours. No results for input(s): AMMONIA in the last 168 hours. Coagulation Profile:  Recent Labs Lab 05/04/16 1607  INR 1.16   Cardiac Enzymes:  Recent Labs Lab 05/04/16 1607 05/04/16 1908 05/04/16 2215 05/05/16 0405  TROPONINI 0.10* 0.05* 0.04* 0.06*   BNP (last 3 results) No results for input(s): PROBNP in the last 8760 hours. HbA1C: No results for input(s): HGBA1C in the last 72 hours. CBG: No results for input(s): GLUCAP in the last 168 hours. Lipid Profile: No results for input(s): CHOL, HDL, LDLCALC, TRIG, CHOLHDL, LDLDIRECT in the last 72 hours. Thyroid Function Tests: No results for input(s): TSH, T4TOTAL, FREET4, T3FREE, THYROIDAB in the last 72 hours. Anemia Panel: No results for input(s): VITAMINB12, FOLATE, FERRITIN, TIBC, IRON, RETICCTPCT in the last 72 hours. Sepsis Labs:  Recent Labs Lab 05/04/16 1607 05/04/16 1908 05/04/16 2126  LATICACIDVEN 2.8* 1.6 1.5    No results found for this or any previous visit (from the past 240 hour(s)).       Radiology Studies: Dg Abd 2 Views  Result Date: 05/05/2016 CLINICAL DATA:  Small bowel obstruction EXAM: ABDOMEN - 2 VIEW COMPARISON:  05/04/2016 FINDINGS: NG tube is in the proximal stomach. Improving small bowel dilatation. No free air organomegaly. Diffuse vascular calcifications noted. IMPRESSION: Improving small bowel dilatation. Electronically Signed   By: Rolm Baptise M.D.   On: 05/05/2016 08:49   Dg  Abd Portable 1v  Result Date: 05/04/2016 CLINICAL DATA:  Abdominal pain BloatedUnable to have bowel movement, symptoms since last nightPatient did have bowel movent an hour agoDialysis patientX smoker 10 yearsHypertensionBypass surgery 10 years ago EXAM: PORTABLE ABDOMEN - 1 VIEW COMPARISON:  CT, 01/14/2009 FINDINGS: There are mildly distended loops of small bowel most evident in the left central abdomen. Some air and stool is seen within a nondistended colon. There are extensive vascular calcifications. Bilateral iliac artery stents are noted. Nasal/orogastric tube passes below the diaphragm well into the stomach. Soft tissues are otherwise unremarkable. IMPRESSION: 1. Small bowel dilation with no colonic distention. This is consistent with a partial small bowel obstruction. Electronically Signed   By: Lajean Manes M.D.   On: 05/04/2016 13:38        Scheduled Meds: . cinacalcet  60 mg Oral Q supper  .  nitroGLYCERIN      . sevelamer carbonate  2.4 g Oral TID WC   Continuous Infusions: . sodium chloride 50 mL/hr at 05/04/16 1751     LOS: 1 day    Time spent: 35 minutes.     Elmarie Shiley, MD [facility] Pager [phone]  If 7PM-7AM, please contact night-coverage [email or web address] Password [email or web address] 05/05/2016, 9:29 AM

## 2016-05-05 NOTE — Consult Note (Signed)
Cardiology Consult    Patient ID: Lathen Cucco MRN: [id number], DOB/AGE: 1951/01/14   Admit date: 05/04/2016 Date of Consult: 05/05/2016  Primary Physician: No primary care provider on file. Reason for Consult: Chest pain, abnormal EKG Primary Cardiologist: Dr. Luiz Ochoa in [place], [place] Requesting Provider: Dr. Janice Norrie  Patient Profile    Mr. Goulette is a 65 year old male with a past medical history of CAD s/p 2 vessel CABG in 2010, HTN, and ESRD on HD. He presented to [facility] hospital on 05/04/16 with abdominal pain, nausea and vomiting, found to have partial SBO and was transferred to [facility] the same day. He developed chest pain that was relieved with Nitro and elevated troponin, Cardiology was consulted.   History of Present Illness    Mr. Bollenbach tells me that last night around 10pm he developed substernal chest pain, reminiscent of his usual angina. He denies associated SOB, he feels generally nauseous with his SBO. He was given one SL Nitro and had relief of his chest pain. EKG at the time showed NSR with lateral ST depression and T wave inversion. His troponin was 0.04>>0.06>>0.10.   His wife tells me that he will get angina sometimes as often as a couple times of week, and other times he will go a few months without having any angina. He takes SL Nitroglycerin at home and this always relieves his pain.   He underwent 2 vessel CABG at [facility] in [place], [place] in 2010 with LIMA to LAD and RIMA to Ramus. His last heart catheterization was in 2014 with showed significant disease in his RCA and attempt at intervention was felt to be too high risk. His last Echo was in Nov. 2016 that revealed EF of 35-40% which his primary Cardiologists comments in his last office visit is not much worse when compared to his prior Echo.   His EKG during his episode of unstable angina does have some worsening ST depression in the lateral leads when compared to his EKG from when he was  admitted yesterday.   Past Medical History   Past Medical History:  Diagnosis Date  . CAD (coronary artery disease) 05/04/2016  . ESRD on dialysis ([place]) 05/04/2016  . GERD (gastroesophageal reflux disease)   . HTN (hypertension) 05/04/2016  . Lymphoma ([place]) 05/04/2016  . SBO (small bowel obstruction) 04/2016    Past Surgical History:  Procedure Laterality Date  . APPENDECTOMY    . CAROTID STENT    . CORONARY ARTERY BYPASS GRAFT    . ENDARTERECTOMY    . HERNIA REPAIR    . KNEE SURGERY    . ROTATOR CUFF REPAIR    . SP AV DIALYSIS SHUNT ACCESS EXISTING *L*       Allergies  No Known Allergies  Inpatient Medications    . cinacalcet  60 mg Oral Q supper  . famotidine (PEPCID) IV  20 mg Intravenous Q12H  . metoprolol  5 mg Intravenous Q6H  . sevelamer carbonate  2.4 g Oral TID WC    Family History    Family History  Problem Relation Age of Onset  . Heart attack Neg Hx     Social History    Social History   Social History  . Marital status: Married    Spouse name: N/A  . Number of children: N/A  . Years of education: N/A   Occupational History  . Not on file.   Social History Main Topics  . Smoking status: Former Research scientist (life sciences)  .  Smokeless tobacco: Never Used     Comment: QUIT SMOKING IN 2007  . Alcohol use No  . Drug use: No  . Sexual activity: Not on file   Other Topics Concern  . Not on file   Social History Narrative  . No narrative on file     Review of Systems    General:  No chills, fever, night sweats or weight changes.  Cardiovascular:  No chest pain, dyspnea on exertion, edema, orthopnea, palpitations, paroxysmal nocturnal dyspnea. Dermatological: No rash, lesions/masses Respiratory: No cough, dyspnea Urologic: No hematuria, dysuria Abdominal:   + nausea, vomiting, diarrhea, bright red blood per rectum, melena, or hematemesis Neurologic:  No visual changes, wkns, changes in mental status. All other systems reviewed and are otherwise negative  except as noted above.  Physical Exam    Blood pressure (!) 163/83, pulse 89, temperature 98.4 F (36.9 C), temperature source Oral, resp. rate 17, SpO2 96 %.  General: well developed male, does not feel well  Psych: Normal affect. Neuro: Alert and oriented X 3. Moves all extremities spontaneously. HEENT: Normal  Neck: Supple without bruits or JVD. Lungs:  Resp regular and unlabored, CTA. Heart: RRR no s3, s4, or murmurs. Abdomen: Soft, non-tender, non-distended, BS + x 4.  Extremities: No clubbing, cyanosis or edema. DP/PT/Radials 2+ and equal bilaterally. Left  Forearm AV fistula +/+  Labs     Recent Labs  05/04/16 1908 05/04/16 2215 05/05/16 0405 05/05/16 1035  TROPONINI 0.05* 0.04* 0.06* 0.10*   Lab Results  Component Value Date   WBC 6.7 05/05/2016   HGB 12.1 (L) 05/05/2016   HCT 37.5 (L) 05/05/2016   MCV 101.1 (H) 05/05/2016   PLT 207 05/05/2016    Recent Labs Lab 05/04/16 1607 05/05/16 0405  NA 139 141  K 5.3* 5.0  CL 95* 97*  CO2 21* 27  BUN 41* 49*  CREATININE 10.59* 11.81*  CALCIUM 10.0 9.7  PROT 7.3  --   BILITOT 0.6  --   ALKPHOS 102  --   ALT 11*  --   AST 23  --   GLUCOSE 95 100*    Radiology Studies    Dg Abd 2 Views  Result Date: 05/05/2016 CLINICAL DATA:  Small bowel obstruction EXAM: ABDOMEN - 2 VIEW COMPARISON:  05/04/2016 FINDINGS: NG tube is in the proximal stomach. Improving small bowel dilatation. No free air organomegaly. Diffuse vascular calcifications noted. IMPRESSION: Improving small bowel dilatation. Electronically Signed   By: Rolm Baptise M.D.   On: 05/05/2016 08:49   Dg Abd Portable 1v  Result Date: 05/04/2016 CLINICAL DATA:  Abdominal pain BloatedUnable to have bowel movement, symptoms since last nightPatient did have bowel movent an hour agoDialysis patientX smoker 10 yearsHypertensionBypass surgery 10 years ago EXAM: PORTABLE ABDOMEN - 1 VIEW COMPARISON:  CT, 01/14/2009 FINDINGS: There are mildly distended loops of  small bowel most evident in the left central abdomen. Some air and stool is seen within a nondistended colon. There are extensive vascular calcifications. Bilateral iliac artery stents are noted. Nasal/orogastric tube passes below the diaphragm well into the stomach. Soft tissues are otherwise unremarkable. IMPRESSION: 1. Small bowel dilation with no colonic distention. This is consistent with a partial small bowel obstruction. Electronically Signed   By: Lajean Manes M.D.   On: 05/04/2016 13:38    EKG & Cardiac Imaging    EKG: NSR, LAFB, ST depression in anterolateral leads.   Echocardiogram: pending  Assessment & Plan    1. Unstable angina:  Mr. Richcreek had an episode of unstable angina last night that was relieved with one SL Nitro. His EKG at the time showed worsening ST depression in the anterolateral leads when compared to EKG at admission. His EKG this am has improved but still with some ST depression.   He has a history of CAD, and 2 vessel CABG in 2010. He and his wife tell me that he frequently has episodes of angina that are relieved with SL Nitro.   Currently, he is chest pain free. Troponin is elevated but with flat trend. We will continue to cycle to see if his trend indicates need for heart cath.   His primary Cardiologist in [place] last saw him on 04/24/16 and he was doing well. He is on isosorbide 60mg  daily, Coreg 6.25mg  BID, ASA, and Norvasc 5mg  daily. On the days that he has HD, he takes a half dose of Coreg and isosorbide as he has some hypotension.   Would not make any changes to his anti-anginal regimen currently as he has a SBO and is not taking po medications.   2. History of CAD s/p CABG in 2010: Review of records shows that patient had heart catheterization in 2014 that showed significant disease in his RCA that was felt to be too high risk for intervention, and he was not considered a re-do surgical candidate.   Suspect that his unstable angina was provoked by  SBO. Would not pursue an ischemic evaluation at this time unless troponin elevates further, does not appear that surgery feels he will need a surgical intervention at this time.   3. ESRD on HD: Renal following, for HD today.   4. Hypertension: Hypertensive, but has only been able to get IV metoprolol, and is for HD today.   5. HLD: On high intensity statin, continue same.    Signed, Arbutus Leas, NP 05/05/2016, 1:12 PM Pager: [phone] Patient seen and examined and history reviewed. Agree with above findings and plan. 65 yo BM with history of CAD s/p CABG in 2010. Office records indicate he had a cardiac cath in July 2014 at [facility] and medical therapy advised. Patient states he had a blockage in the back of his heart that could not be opened. No cath report available. Since that time he has had daily chest pain relieved with Ntg. He was admitted with SBO. NG tube placed. Following this he had pain in left upper chest relieved with Ntg. Now pain free. Symptoms similar to his chronic symptoms. He has long history of PAD, ESRD, HTN, and HLD. On exam he appears chronically ill. NG in place.  Lungs clear.  CV RRR without gallop or murmur. Pulses palpable. Multiple scars LE. No edema.  Ecgs, labs and Xrays reviewed  Impression: 1. Angina pectoris. This appears to be his baseline angina. He did have more prominent ST elevation in the lateral leads with pain. He does have chronic ST-T changes. Troponin slightly elevated. I suspect this may be more demand ischemia. Currently he is NPO so hasn't been able to take his usual medication. Will treat for now with IV lopressor and add topical nitrates. Will check Echo. When able to take po's well can initiate a good antianginal regimen. At this point I would not recommend further ischemic evaluation. A stress test is not going to be very helpful. If he has refractory angina or significant increase in troponin on further testing could consider cardiac cath.    2. SBO 3. ESRD on HD 4. HTN  5. PAD  Peter Martinique, [place] 05/05/2016 5:09 PM

## 2016-05-05 NOTE — Progress Notes (Signed)
Admit: 05/04/2016 LOS: 1  81M ESRD MWF [place] with SBO  Subjective:  Passing flatus AXR improving Less abd pain   10/26 0701 - 10/27 0700 In: 1064.6 [I.V.:1034.6; NG/GT:30] Out: 500 [Emesis/NG output:500]  There were no vitals filed for this visit.  Scheduled Meds: . cinacalcet  60 mg Oral Q supper  . famotidine (PEPCID) IV  20 mg Intravenous Q12H  . metoprolol  5 mg Intravenous Q6H  . sevelamer carbonate  2.4 g Oral TID WC   Continuous Infusions: . sodium chloride     PRN Meds:.fentaNYL (SUBLIMAZE) injection, hydrALAZINE, LORazepam, nitroGLYCERIN, ondansetron (ZOFRAN) IV **OR** promethazine, phenol  Current Labs: reviewed    Physical Exam:  Blood pressure (!) 163/83, pulse 89, temperature 98.4 F (36.9 C), temperature source Oral, resp. rate 17, SpO2 96 %. NAD NGT in place RRR abd ND, +BS No LEE AVF LFA +B/T  A 1. ESRD MWF [place] [facility] via L FA AVF 2. SBO with gastric decmopression; surgery following 3. HTN 4. BMD on sensipar and binders - held while SBO  P 1. HD today 2. Req outpt orders   Pearson Grippe MD 05/05/2016, 11:45 AM   Recent Labs Lab 05/04/16 1607 05/05/16 0405  NA 139 141  K 5.3* 5.0  CL 95* 97*  CO2 21* 27  GLUCOSE 95 100*  BUN 41* 49*  CREATININE 10.59* 11.81*  CALCIUM 10.0 9.7  PHOS 6.0* 6.1*    Recent Labs Lab 05/04/16 1607 05/05/16 0405  WBC 8.0 6.7  NEUTROABS 5.6  --   HGB 13.6 12.1*  HCT 41.9 37.5*  MCV 103.2* 101.1*  PLT 193 207

## 2016-05-05 NOTE — Progress Notes (Signed)
  Subjective: Passing lots of flatus.  Denies nausea.  Feels okay.  Denies pain. NG output 500 mL overnight Abdominal x-rays pending.  Going down now. Anticipating dialysis today.  Appreciate Dr. Etheleen Nicks input.  WBC 6700.  Hemoglobin 12.1.  BUN 49.  Creatinine 11.8.  Potassium 5.0.   Objective: Vital signs in last 24 hours: Temp:  [98.3 F (36.8 C)-98.8 F (37.1 C)] 98.4 F (36.9 C) (10/27 0505) Pulse Rate:  [81-92] 89 (10/27 0505) Resp:  [17-20] 17 (10/27 0505) BP: (149-171)/(69-89) 163/83 (10/27 0505) SpO2:  [95 %-100 %] 96 % (10/27 0505) Last BM Date: 05/04/16  Intake/Output from previous day: 10/26 0701 - 10/27 0700 In: 1064.6 [I.V.:1034.6; NG/GT:30] Out: 500 [Emesis/NG output:500] Intake/Output this shift: No intake/output data recorded.   EXAM: General appearance: Alert.  Cooperative.  No distress.  Ht NG tube.  Bilious drainage. Resp: clear to auscultation bilaterally GI: Abdomen soft.  Doesn't seem distended or tender.  Small reducible umbilical hernia.  Nontender.  No other hernias or masses noted.  Bowel sounds active. Extremities: Left upper exam he fistula noted.  Lab Results:   Recent Labs  05/04/16 1607 05/05/16 0405  WBC 8.0 6.7  HGB 13.6 12.1*  HCT 41.9 37.5*  PLT 193 207   BMET  Recent Labs  05/04/16 1607 05/05/16 0405  NA 139 141  K 5.3* 5.0  CL 95* 97*  CO2 21* 27  GLUCOSE 95 100*  BUN 41* 49*  CREATININE 10.59* 11.81*  CALCIUM 10.0 9.7   PT/INR  Recent Labs  05/04/16 1607  LABPROT 14.8  INR 1.16   ABG No results for input(s): PHART, HCO3 in the last 72 hours.  Invalid input(s): PCO2, PO2  Studies/Results: Dg Abd Portable 1v  Result Date: 05/04/2016 CLINICAL DATA:  Abdominal pain BloatedUnable to have bowel movement, symptoms since last nightPatient did have bowel movent an hour agoDialysis patientX smoker 10 yearsHypertensionBypass surgery 10 years ago EXAM: PORTABLE ABDOMEN - 1 VIEW COMPARISON:  CT, 01/14/2009  FINDINGS: There are mildly distended loops of small bowel most evident in the left central abdomen. Some air and stool is seen within a nondistended colon. There are extensive vascular calcifications. Bilateral iliac artery stents are noted. Nasal/orogastric tube passes below the diaphragm well into the stomach. Soft tissues are otherwise unremarkable. IMPRESSION: 1. Small bowel dilation with no colonic distention. This is consistent with a partial small bowel obstruction. Electronically Signed   By: Lajean Manes M.D.   On: 05/04/2016 13:38    Anti-infectives: Anti-infectives    None      Assessment/Plan:  Partial small bowel obstruction.  Clinically this is resolving. No sign of bowel compromise Abdominal films pending.  If these are somewhat better we will clamp NG and allow clear liquids. Continue parenteral medications for now  History of appendectomy and PD catheter ESRD on hemodialysis MWF.  [place].  Plan hemodialysis today Status post CABG 2010 Peripheral vascular disease with right carotid and bilateral iliac stents Hypertension History lymphoma with chemotherapy 1995 Former tobacco abuse.    LOS: 1 day    Jeremy Kirby 05/05/2016

## 2016-05-06 DIAGNOSIS — I25799 Atherosclerosis of other coronary artery bypass graft(s) with unspecified angina pectoris: Secondary | ICD-10-CM

## 2016-05-06 LAB — RENAL FUNCTION PANEL
ANION GAP: 15 (ref 5–15)
Albumin: 3.5 g/dL (ref 3.5–5.0)
BUN: 26 mg/dL — ABNORMAL HIGH (ref 6–20)
CALCIUM: 9.3 mg/dL (ref 8.9–10.3)
CO2: 28 mmol/L (ref 22–32)
Chloride: 95 mmol/L — ABNORMAL LOW (ref 101–111)
Creatinine, Ser: 7.89 mg/dL — ABNORMAL HIGH (ref 0.61–1.24)
GFR calc non Af Amer: 6 mL/min — ABNORMAL LOW (ref >60)
GFR, EST AFRICAN AMERICAN: 7 mL/min — AB (ref >60)
Glucose, Bld: 82 mg/dL (ref 65–99)
POTASSIUM: 4.4 mmol/L (ref 3.5–5.1)
Phosphorus: 5.9 mg/dL — ABNORMAL HIGH (ref 2.5–4.6)
SODIUM: 138 mmol/L (ref 135–145)

## 2016-05-06 LAB — HEPATITIS B SURFACE ANTIGEN: Hepatitis B Surface Ag: NEGATIVE

## 2016-05-06 LAB — CBC
HEMATOCRIT: 38.8 % — AB (ref 39.0–52.0)
HEMOGLOBIN: 12.8 g/dL — AB (ref 13.0–17.0)
MCH: 33.8 pg (ref 26.0–34.0)
MCHC: 33 g/dL (ref 30.0–36.0)
MCV: 102.4 fL — AB (ref 78.0–100.0)
Platelets: 206 10*3/uL (ref 150–400)
RBC: 3.79 MIL/uL — AB (ref 4.22–5.81)
RDW: 15.5 % (ref 11.5–15.5)
WBC: 8.5 10*3/uL (ref 4.0–10.5)

## 2016-05-06 LAB — TROPONIN I: TROPONIN I: 0.28 ng/mL — AB (ref <0.03)

## 2016-05-06 MED ORDER — CARVEDILOL 3.125 MG PO TABS
3.1250 mg | ORAL_TABLET | Freq: Two times a day (BID) | ORAL | Status: DC
  Administered 2016-05-06 – 2016-05-07 (×3): 3.125 mg via ORAL
  Filled 2016-05-06 (×3): qty 1

## 2016-05-06 MED ORDER — FAMOTIDINE 20 MG PO TABS
20.0000 mg | ORAL_TABLET | Freq: Every day | ORAL | Status: DC
  Administered 2016-05-07: 20 mg via ORAL
  Filled 2016-05-06: qty 1

## 2016-05-06 MED ORDER — FAMOTIDINE 20 MG PO TABS: 20.0000 mg | ORAL_TABLET | Freq: Two times a day (BID) | ORAL | Status: DC

## 2016-05-06 MED ORDER — NITROGLYCERIN 0.4 MG SL SUBL: 0.4000 mg | SUBLINGUAL_TABLET | SUBLINGUAL | Status: DC | PRN

## 2016-05-06 MED ORDER — AMLODIPINE BESYLATE 5 MG PO TABS
5.0000 mg | ORAL_TABLET | Freq: Every day | ORAL | Status: DC
  Filled 2016-05-06: qty 1

## 2016-05-06 MED ORDER — ASPIRIN EC 81 MG PO TBEC
81.0000 mg | DELAYED_RELEASE_TABLET | Freq: Every day | ORAL | Status: DC
  Administered 2016-05-06 – 2016-05-07 (×2): 81 mg via ORAL
  Filled 2016-05-06 (×2): qty 1

## 2016-05-06 MED ORDER — ISOSORBIDE MONONITRATE ER 60 MG PO TB24
60.0000 mg | ORAL_TABLET | Freq: Every day | ORAL | Status: DC
  Administered 2016-05-06 – 2016-05-07 (×2): 60 mg via ORAL
  Filled 2016-05-06 (×2): qty 1

## 2016-05-06 NOTE — Progress Notes (Addendum)
Patient Name: Jeremy Kirby Date of Encounter: 05/06/2016  Primary Cardiologist: Dr. Luiz Kirby in [place], [facility] Problem List     Principal Problem:   Bowel obstruction Active Problems:   ESRD on dialysis (HCC)   HTN (hypertension)   Lymphoma (HCC)   CAD (coronary artery disease)   SBO (small bowel obstruction)   Nonspecific abnormal electrocardiogram (ECG) (EKG)     Subjective   NGT remains in but tried clear liquids this am.  Denies any chest pain or SOB.  Complains of sore throat  Inpatient Medications    Scheduled Meds: . cinacalcet  60 mg Oral Q supper  . famotidine (PEPCID) IV  20 mg Intravenous Q12H  . metoprolol  5 mg Intravenous Q6H  . nitroGLYCERIN  0.5 inch Topical Q6H  . sevelamer carbonate  2.4 g Oral TID WC   Continuous Infusions: . sodium chloride 50 mL/hr at 05/05/16 1000   PRN Meds: fentaNYL (SUBLIMAZE) injection, hydrALAZINE, LORazepam, nitroGLYCERIN, ondansetron (ZOFRAN) IV **OR** promethazine, phenol   Vital Signs    Vitals:   05/05/16 1832 05/05/16 1859 05/05/16 2120 05/06/16 0509  BP: (!) 96/53 (!) 113/57 119/61 (!) 159/77  Pulse: 90 90 99 82  Resp:  18 16 16   Temp:  98.3 F (36.8 C) 98.7 F (37.1 C) 98.7 F (37.1 C)  TempSrc:  Oral Oral Oral  SpO2:  100% 94% 97%  Weight:  165 lb 12.6 oz (75.2 kg)      Intake/Output Summary (Last 24 hours) at 05/06/16 0901 Last data filed at 05/06/16 0510  Gross per 24 hour  Intake              110 ml  Output             2250 ml  Net            -2140 ml   Filed Weights   05/05/16 1500 05/05/16 1859  Weight: 170 lb 6.7 oz (77.3 kg) 165 lb 12.6 oz (75.2 kg)    Physical Exam    GEN: Well nourished, well developed, in no acute distress.  HEENT: Grossly normal.  Neck: Supple, no JVD, carotid bruits, or masses. Cardiac: RRR, no murmurs, rubs, or gallops. No clubbing, cyanosis, edema.  Radials/DP/PT 2+ and equal bilaterally.  Respiratory:  Respirations regular and unlabored, clear to  auscultation bilaterally. GI: Soft, nontender, nondistended, BS + x 4. MS: no deformity or atrophy. Skin: warm and dry, no rash. Neuro:  Strength and sensation are intact. Psych: AAOx3.  Normal affect.  Labs    CBC  Recent Labs  05/04/16 1607 05/05/16 0405 05/06/16 0027  WBC 8.0 6.7 8.5  NEUTROABS 5.6  --   --   HGB 13.6 12.1* 12.8*  HCT 41.9 37.5* 38.8*  MCV 103.2* 101.1* 102.4*  PLT 193 207 [id number]   Basic Metabolic Panel  Recent Labs  05/04/16 1607 05/05/16 0405 05/06/16 0027  NA 139 141 138  K 5.3* 5.0 4.4  CL 95* 97* 95*  CO2 21* 27 28  GLUCOSE 95 100* 82  BUN 41* 49* 26*  CREATININE 10.59* 11.81* 7.89*  CALCIUM 10.0 9.7 9.3  MG 2.6*  --   --   PHOS 6.0* 6.1* 5.9*   Liver Function Tests  Recent Labs  05/04/16 1607 05/05/16 0405 05/06/16 0027  AST 23  --   --   ALT 11*  --   --   ALKPHOS 102  --   --   BILITOT 0.6  --   --  PROT 7.3  --   --   ALBUMIN 3.9 3.3* 3.5   No results for input(s): LIPASE, AMYLASE in the last 72 hours. Cardiac Enzymes  Recent Labs  05/05/16 1035 05/05/16 1631 05/06/16 0027  TROPONINI 0.10* 0.28* 0.28*   BNP Invalid input(s): POCBNP D-Dimer No results for input(s): DDIMER in the last 72 hours. Hemoglobin A1C No results for input(s): HGBA1C in the last 72 hours. Fasting Lipid Panel No results for input(s): CHOL, HDL, LDLCALC, TRIG, CHOLHDL, LDLDIRECT in the last 72 hours. Thyroid Function Tests No results for input(s): TSH, T4TOTAL, T3FREE, THYROIDAB in the last 72 hours.  Invalid input(s): FREET3  Telemetry     - Personally Reviewed and shows an 8 beat run of WCT that is c/w artifact  ECG     - Personally Reviewed  Radiology    Dg Abd 2 Views  Result Date: 05/05/2016 CLINICAL DATA:  Small bowel obstruction EXAM: ABDOMEN - 2 VIEW COMPARISON:  05/04/2016 FINDINGS: NG tube is in the proximal stomach. Improving small bowel dilatation. No free air organomegaly. Diffuse vascular calcifications noted.  IMPRESSION: Improving small bowel dilatation. Electronically Signed   By: Jeremy Kirby M.D.   On: 05/05/2016 08:49   Dg Abd Portable 1v  Result Date: 05/04/2016 CLINICAL DATA:  Abdominal pain BloatedUnable to have bowel movement, symptoms since last nightPatient did have bowel movent an hour agoDialysis patientX smoker 10 yearsHypertensionBypass surgery 10 years ago EXAM: PORTABLE ABDOMEN - 1 VIEW COMPARISON:  CT, 01/14/2009 FINDINGS: There are mildly distended loops of small bowel most evident in the left central abdomen. Some air and stool is seen within a nondistended colon. There are extensive vascular calcifications. Bilateral iliac artery stents are noted. Nasal/orogastric tube passes below the diaphragm well into the stomach. Soft tissues are otherwise unremarkable. IMPRESSION: 1. Small bowel dilation with no colonic distention. This is consistent with a partial small bowel obstruction. Electronically Signed   By: Jeremy Kirby M.D.   On: 05/04/2016 13:38    Cardiac Studies   Trop 0.1>>0.28  Patient Profile     Jeremy Kirby is a 65 year old male with a past medical history of CAD s/p 2 vessel CABG in 2010, HTN, and ESRD on HD. He presented to [facility] hospital on 05/04/16 with abdominal pain, nausea and vomiting, found to have partial SBO and was transferred to [facility] the same day. He developed chest pain that was relieved with Nitro and elevated troponin, Cardiology was consulted.   Assessment & Plan    1. Unstable angina: Jeremy Kirby had an episode of unstable angina that was relieved with one SL Nitro. His EKG at the time showed worsening ST depression in the anterolateral leads when compared to EKG at admission. Currently, he is chest pain free. Troponin is elevated but with flat trend.  His primary Cardiologist in [place] last saw him on 04/24/16 and he was doing well. At home he is on isosorbide 60mg  daily, Coreg 6.25mg  BID, ASA, and Norvasc 5mg  daily. On the days that he has  HD, he takes a half dose of Coreg and isosorbide as he has some hypotension. He is currently NPO but is starting clear liquids this am.  Will treat for now with IV lopressor and  topical nitrates. Will check Echo. When able to adequately take po's we can initiate a good antianginal regimen. At this point I would not recommend further ischemic evaluation. A stress test is not going to be very helpful. If he has refractory angina  or significant increase in troponin on further testing could consider cardiac cath.   2. History of CAD s/p CABG in 2010: Review of records shows that patient had heart catheterization in 2014 that showed significant disease in his RCA that was felt to be too high risk for intervention, and he was not considered a re-do surgical candidate.   Suspect that his unstable angina was provoked by SBO. Would not pursue an ischemic evaluation at this time unless troponin elevates further, does not appear that surgery feels he will need a surgical intervention at this time.   3. ESRD on HD: Renal following.   4. Hypertension: BP improved after HD.  5. HLD: On high intensity statin, continue same.   6.  WCT noted on heart monitor is artifact and not VT   Signed, Fransico Him, MD  05/06/2016, 9:01 AM

## 2016-05-06 NOTE — Progress Notes (Signed)
Admit: 05/04/2016 LOS: 2  56M ESRD MWF [place] with SBO  Subjective:  HD yesterday, 2L UF; post weight 75.2kg; BP stable Improving abd symptoms; NGT clamped, +flatus  10/27 0701 - 10/28 0700 In: 110 [P.O.:60; IV Piggyback:50] Out: 2250 [Emesis/NG output:250]  Filed Weights   05/05/16 1500 05/05/16 1859  Weight: 77.3 kg (170 lb 6.7 oz) 75.2 kg (165 lb 12.6 oz)    Scheduled Meds: . cinacalcet  60 mg Oral Q supper  . famotidine (PEPCID) IV  20 mg Intravenous Q12H  . metoprolol  5 mg Intravenous Q6H  . nitroGLYCERIN  0.5 inch Topical Q6H  . sevelamer carbonate  2.4 g Oral TID WC   Continuous Infusions: . sodium chloride 50 mL/hr at 05/05/16 1000   PRN Meds:.fentaNYL (SUBLIMAZE) injection, hydrALAZINE, LORazepam, nitroGLYCERIN, ondansetron (ZOFRAN) IV **OR** promethazine, phenol  Current Labs: reviewed    Physical Exam:  Blood pressure (!) 159/77, pulse 82, temperature 98.7 F (37.1 C), temperature source Oral, resp. rate 16, weight 75.2 kg (165 lb 12.6 oz), SpO2 97 %. NAD NGT in place RRR abd ND, +BS No LEE AVF LFA +B/T  A 1. ESRD MWF [place] [facility] via L FA AVF 2. SBO with gastric decmopression; surgery following; resolving 3. HTN 4. BMD on sensipar and binders - held while SBO  P 1. HD MWF, stable 2. Req outpt orders   Pearson Grippe MD 05/06/2016, 9:01 AM   Recent Labs Lab 05/04/16 1607 05/05/16 0405 05/06/16 0027  NA 139 141 138  K 5.3* 5.0 4.4  CL 95* 97* 95*  CO2 21* 27 28  GLUCOSE 95 100* 82  BUN 41* 49* 26*  CREATININE 10.59* 11.81* 7.89*  CALCIUM 10.0 9.7 9.3  PHOS 6.0* 6.1* 5.9*    Recent Labs Lab 05/04/16 1607 05/05/16 0405 05/06/16 0027  WBC 8.0 6.7 8.5  NEUTROABS 5.6  --   --   HGB 13.6 12.1* 12.8*  HCT 41.9 37.5* 38.8*  MCV 103.2* 101.1* 102.4*  PLT 193 207 206

## 2016-05-06 NOTE — Progress Notes (Signed)
PROGRESS NOTE    Jeremy Kirby  [id number] DOB: 1951/06/26 DOA: 05/04/2016 PCP: No primary care provider on file.   Brief Narrative: Jeremy Kirby is a 65 y.o. male past medical history of CABG, cardiac stents, unstable angina, lymphoma status post excision, AAA, chronic kidney disease, hyperlipidemia, hypertension who presents with nausea and vomiting. Patient is a transfer from [name]. He presented to [facility] after lunch, ate a hoagie. He ate breakfast normally. He presented with profuse nausea and vomiting. . Patient denies any suspicious ingestions. Patient denies any abdominal trauma. Patient went to the emergency room for evaluation. CT abdomen pelvis done that showed a high-grade small bowel obstruction and distal small bowel. The facility transfer the patient to [name] due to lack of beds.  Assessment & Plan:   Principal Problem:   Bowel obstruction Active Problems:   ESRD on dialysis (HCC)   HTN (hypertension)   Lymphoma (HCC)   CAD (coronary artery disease)   SBO (small bowel obstruction)   Nonspecific abnormal electrocardiogram (ECG) (EKG)   1-Bowel obstruction Surgery consulted and following.  Elevated lactic acid likely due to bowel obstruction or dehydration PRN fentanyl X ray with improved Small bowel dilation.  IV Pepcid.  NG tube removed. Diet to be advanced to full liquid.  No BM yet   2-History of unstable angina. Abnormal EKG History of CABG 2010, H/O blockage 2014 Patient with anterior lateral changes on EKG. Will request record from PCP.  Serial troponin mildly elevated, flat.  ECHO pending  He  is chest pain free this am. Had some chest pressure 10-26  improved with nitroglycerin.  Review EKG from [name].  patient with pronounce T wave inversion lead V 4 and V 5 on EKG from last night.  cardiology consulted. Appreciate evaluation. Patient started on nitropaste. No further evaluation.  Will resume cardia meds, now that he able to tolerates  oral.    3-End-stage renal disease Nephrology consultation HD 10-27 Regular schedule for HD MWF.   4-Hypertension  hydralazine 10 mg IV as needed for severe blood pressure Resume coreg, imdur Norvasc.   5-Lymphoma, inactive for 28yrs Monitor CBC   DVT prophylaxis: scd.  Code Status: Full code.  Family Communication: care discussed with patient  Disposition Plan: remain inpatient   Consultants:   Nephrology  surgery   Procedures:   HD   Antimicrobials:  none  Subjective: He is feeling better, no abdominal pain. Tolerating clear diet.  No BM yet    Objective: Vitals:   05/05/16 1832 05/05/16 1859 05/05/16 2120 05/06/16 0509  BP: (!) 96/53 (!) 113/57 119/61 (!) 159/77  Pulse: 90 90 99 82  Resp:  18 16 16   Temp:  98.3 F (36.8 C) 98.7 F (37.1 C) 98.7 F (37.1 C)  TempSrc:  Oral Oral Oral  SpO2:  100% 94% 97%  Weight:  75.2 kg (165 lb 12.6 oz)      Intake/Output Summary (Last 24 hours) at 05/06/16 1049 Last data filed at 05/06/16 0510  Gross per 24 hour  Intake              110 ml  Output             2250 ml  Net            -2140 ml   Filed Weights   05/05/16 1500 05/05/16 1859  Weight: 77.3 kg (170 lb 6.7 oz) 75.2 kg (165 lb 12.6 oz)    Examination:  General exam: Appears calm and  comfortable  Respiratory system: Clear to auscultation. Respiratory effort normal. Cardiovascular system: S1 & S2 heard, RRR. No JVD, murmurs, rubs, gallops or clicks. No pedal edema. Gastrointestinal system: Abdomen is nondistended, soft and nontender. No organomegaly or masses felt. Normal bowel sounds heard. Central nervous system: Alert and oriented. No focal neurological deficits. Extremities: Symmetric 5 x 5 power. Skin: No rashes, lesions or ulcers Psychiatry: Judgement and insight appear normal. Mood & affect appropriate.     Data Reviewed: I have personally reviewed following labs and imaging studies  CBC:  Recent Labs Lab 05/04/16 1607  05/05/16 0405 05/06/16 0027  WBC 8.0 6.7 8.5  NEUTROABS 5.6  --   --   HGB 13.6 12.1* 12.8*  HCT 41.9 37.5* 38.8*  MCV 103.2* 101.1* 102.4*  PLT 193 207 [id number]   Basic Metabolic Panel:  Recent Labs Lab 05/04/16 1607 05/05/16 0405 05/06/16 0027  NA 139 141 138  K 5.3* 5.0 4.4  CL 95* 97* 95*  CO2 21* 27 28  GLUCOSE 95 100* 82  BUN 41* 49* 26*  CREATININE 10.59* 11.81* 7.89*  CALCIUM 10.0 9.7 9.3  MG 2.6*  --   --   PHOS 6.0* 6.1* 5.9*   GFR: CrCl cannot be calculated (Unknown ideal weight.). Liver Function Tests:  Recent Labs Lab 05/04/16 1607 05/05/16 0405 05/06/16 0027  AST 23  --   --   ALT 11*  --   --   ALKPHOS 102  --   --   BILITOT 0.6  --   --   PROT 7.3  --   --   ALBUMIN 3.9 3.3* 3.5   No results for input(s): LIPASE, AMYLASE in the last 168 hours. No results for input(s): AMMONIA in the last 168 hours. Coagulation Profile:  Recent Labs Lab 05/04/16 1607  INR 1.16   Cardiac Enzymes:  Recent Labs Lab 05/04/16 2215 05/05/16 0405 05/05/16 1035 05/05/16 1631 05/06/16 0027  TROPONINI 0.04* 0.06* 0.10* 0.28* 0.28*   BNP (last 3 results) No results for input(s): PROBNP in the last 8760 hours. HbA1C: No results for input(s): HGBA1C in the last 72 hours. CBG: No results for input(s): GLUCAP in the last 168 hours. Lipid Profile: No results for input(s): CHOL, HDL, LDLCALC, TRIG, CHOLHDL, LDLDIRECT in the last 72 hours. Thyroid Function Tests: No results for input(s): TSH, T4TOTAL, FREET4, T3FREE, THYROIDAB in the last 72 hours. Anemia Panel: No results for input(s): VITAMINB12, FOLATE, FERRITIN, TIBC, IRON, RETICCTPCT in the last 72 hours. Sepsis Labs:  Recent Labs Lab 05/04/16 1607 05/04/16 1908 05/04/16 2126  LATICACIDVEN 2.8* 1.6 1.5    No results found for this or any previous visit (from the past 240 hour(s)).       Radiology Studies: Dg Abd 2 Views  Result Date: 05/05/2016 CLINICAL DATA:  Small bowel obstruction EXAM:  ABDOMEN - 2 VIEW COMPARISON:  05/04/2016 FINDINGS: NG tube is in the proximal stomach. Improving small bowel dilatation. No free air organomegaly. Diffuse vascular calcifications noted. IMPRESSION: Improving small bowel dilatation. Electronically Signed   By: Rolm Baptise M.D.   On: 05/05/2016 08:49   Dg Abd Portable 1v  Result Date: 05/04/2016 CLINICAL DATA:  Abdominal pain BloatedUnable to have bowel movement, symptoms since last nightPatient did have bowel movent an hour agoDialysis patientX smoker 10 yearsHypertensionBypass surgery 10 years ago EXAM: PORTABLE ABDOMEN - 1 VIEW COMPARISON:  CT, 01/14/2009 FINDINGS: There are mildly distended loops of small bowel most evident in the left central abdomen. Some air and  stool is seen within a nondistended colon. There are extensive vascular calcifications. Bilateral iliac artery stents are noted. Nasal/orogastric tube passes below the diaphragm well into the stomach. Soft tissues are otherwise unremarkable. IMPRESSION: 1. Small bowel dilation with no colonic distention. This is consistent with a partial small bowel obstruction. Electronically Signed   By: Lajean Manes M.D.   On: 05/04/2016 13:38        Scheduled Meds: . cinacalcet  60 mg Oral Q supper  . famotidine (PEPCID) IV  20 mg Intravenous Q12H  . metoprolol  5 mg Intravenous Q6H  . nitroGLYCERIN  0.5 inch Topical Q6H  . sevelamer carbonate  2.4 g Oral TID WC   Continuous Infusions:     LOS: 2 days    Time spent: 35 minutes.     Elmarie Shiley, MD [facility] Pager [phone]  If 7PM-7AM, please contact night-coverage [email or web address] Password [email or web address] 05/06/2016, 10:49 AM

## 2016-05-06 NOTE — Progress Notes (Signed)
  Subjective: NG clamped.  Tolerating clear liquids.  Denies pain or nausea.  Fat passing lots of flatus.  No stool.  Only complaint is sore throat. Alert and stable. Hemoglobin 12.8.  WBC 8500.  Potassium 4.4.  BUN 26.  Creatinine 7.89.  Objective: Vital signs in last 24 hours: Temp:  [98 F (36.7 C)-98.7 F (37.1 C)] 98.7 F (37.1 C) (10/28 0509) Pulse Rate:  [81-99] 82 (10/28 0509) Resp:  [13-18] 16 (10/28 0509) BP: (96-159)/(53-85) 159/77 (10/28 0509) SpO2:  [94 %-100 %] 97 % (10/28 0509) Weight:  [75.2 kg (165 lb 12.6 oz)-77.3 kg (170 lb 6.7 oz)] 75.2 kg (165 lb 12.6 oz) (10/27 1859) Last BM Date: 05/04/16  Intake/Output from previous day: 10/27 0701 - 10/28 0700 In: 110 [P.O.:60; IV Piggyback:50] Out: 2250 [Emesis/NG output:250] Intake/Output this shift: No intake/output data recorded.   General appearance: Alert.  Cooperative.  No distress.   NG tube.   clamp. Resp: clear to auscultation bilaterally GI: Abdomen soft.  Doesn't seem distended or tender.  Small reducible umbilical hernia.  Nontender.  No other hernias or masses noted.  Bowel sounds active. Extremities:LUE fistula noted.   Lab Results:   Recent Labs  05/05/16 0405 05/06/16 0027  WBC 6.7 8.5  HGB 12.1* 12.8*  HCT 37.5* 38.8*  PLT 207 206   BMET  Recent Labs  05/05/16 0405 05/06/16 0027  NA 141 138  K 5.0 4.4  CL 97* 95*  CO2 27 28  GLUCOSE 100* 82  BUN 49* 26*  CREATININE 11.81* 7.89*  CALCIUM 9.7 9.3   PT/INR  Recent Labs  05/04/16 1607  LABPROT 14.8  INR 1.16   ABG No results for input(s): PHART, HCO3 in the last 72 hours.  Invalid input(s): PCO2, PO2  Studies/Results: Dg Abd 2 Views  Result Date: 05/05/2016 CLINICAL DATA:  Small bowel obstruction EXAM: ABDOMEN - 2 VIEW COMPARISON:  05/04/2016 FINDINGS: NG tube is in the proximal stomach. Improving small bowel dilatation. No free air organomegaly. Diffuse vascular calcifications noted. IMPRESSION: Improving small bowel  dilatation. Electronically Signed   By: Rolm Baptise M.D.   On: 05/05/2016 08:49   Dg Abd Portable 1v  Result Date: 05/04/2016 CLINICAL DATA:  Abdominal pain BloatedUnable to have bowel movement, symptoms since last nightPatient did have bowel movent an hour agoDialysis patientX smoker 10 yearsHypertensionBypass surgery 10 years ago EXAM: PORTABLE ABDOMEN - 1 VIEW COMPARISON:  CT, 01/14/2009 FINDINGS: There are mildly distended loops of small bowel most evident in the left central abdomen. Some air and stool is seen within a nondistended colon. There are extensive vascular calcifications. Bilateral iliac artery stents are noted. Nasal/orogastric tube passes below the diaphragm well into the stomach. Soft tissues are otherwise unremarkable. IMPRESSION: 1. Small bowel dilation with no colonic distention. This is consistent with a partial small bowel obstruction. Electronically Signed   By: Lajean Manes M.D.   On: 05/04/2016 13:38    Anti-infectives: Anti-infectives    None      Assessment/Plan:    Partial small bowel obstruction.  Clinically  resolving. No sign of bowel compromise Discontinue NG tube and advance to full liquid diet Lab work tomorrow  History of appendectomy and PD catheter ESRD on hemodialysis MWF.  [place].  Plan hemodialysis MWF Status post CABG 2010 Peripheral vascular disease with right carotid and bilateral iliac stents Hypertension History lymphoma with chemotherapy 1995 Former tobacco abuse.    LOS: 2 days    Ritesh Opara M 05/06/2016

## 2016-05-07 ENCOUNTER — Inpatient Hospital Stay (HOSPITAL_COMMUNITY): Payer: Medicare Other

## 2016-05-07 LAB — CBC
HEMATOCRIT: 33.9 % — AB (ref 39.0–52.0)
HEMOGLOBIN: 10.7 g/dL — AB (ref 13.0–17.0)
MCH: 32.1 pg (ref 26.0–34.0)
MCHC: 31.6 g/dL (ref 30.0–36.0)
MCV: 101.8 fL — AB (ref 78.0–100.0)
Platelets: 181 10*3/uL (ref 150–400)
RBC: 3.33 MIL/uL — ABNORMAL LOW (ref 4.22–5.81)
RDW: 15.2 % (ref 11.5–15.5)
WBC: 5.6 10*3/uL (ref 4.0–10.5)

## 2016-05-07 LAB — BASIC METABOLIC PANEL
Anion gap: 12 (ref 5–15)
BUN: 46 mg/dL — ABNORMAL HIGH (ref 6–20)
CALCIUM: 8.9 mg/dL (ref 8.9–10.3)
CHLORIDE: 99 mmol/L — AB (ref 101–111)
CO2: 27 mmol/L (ref 22–32)
CREATININE: 11.62 mg/dL — AB (ref 0.61–1.24)
GFR calc Af Amer: 5 mL/min — ABNORMAL LOW (ref >60)
GFR calc non Af Amer: 4 mL/min — ABNORMAL LOW (ref >60)
GLUCOSE: 98 mg/dL (ref 65–99)
Potassium: 4.2 mmol/L (ref 3.5–5.1)
Sodium: 138 mmol/L (ref 135–145)

## 2016-05-07 LAB — ECHOCARDIOGRAM COMPLETE: WEIGHTICAEL: 2652.57 [oz_av]

## 2016-05-07 NOTE — Discharge Summary (Signed)
Physician Discharge Summary  Jeremy Kirby [id number] DOB: Jun 22, 1951 DOA: 05/04/2016  PCP: No primary care provider on file.  Admit date: 05/04/2016 Discharge date: 05/07/2016  Admitted From: Home  Disposition:  Home   Recommendations for Outpatient Follow-up:  1. Follow up with PCP in 1-2 weeks 2. Please obtain BMP/CBC in one week    Discharge Condition: stable.  CODE STATUS: Full code.  Diet recommendation: Heart Healthy  Brief/Interim Summary: Jeremy Kirby a 65 y.o.malepast medical history of CABG, cardiac stents, unstable angina, lymphoma status post excision, AAA, chronic kidney disease, hyperlipidemia, hypertension who presents with nausea and vomiting. Patient is a transfer from [facility]. He presented to [facility] after lunch, ate a hoagie. He ate breakfast normally. He presented with profuse nausea and vomiting. . Patient denies any suspicious ingestions. Patient denies any abdominal trauma. Patient went to the emergency room for evaluation. CT abdomen pelvis done that showed a high-grade small bowel obstruction and distal small bowel. The facility transfer the patient to [name] due to lack of beds.  Assessment & Plan: 1-Bowel obstruction Surgery consulted and following.  Elevated lactic acid likely due to bowel obstruction or dehydration PRN fentanyl. IV Pepcid.  X ray with improved Small bowel dilation.  NG tube removed. Diet to be advanced to full liquid.  Had 2 BM in 24 hours. Plan to discharge today   2-History of unstable angina. Abnormal EKG History of CABG 2010, H/O blockage 2014 Patient with anterior lateral changes on EKG. Will request record from PCP.  Serial troponin mildly elevated, flat.  ECHO pending  He  is chest pain free this am. Had some chest pressure 10-26  improved with nitroglycerin.  Review EKG from [place].  patient with pronounce T wave inversion lead V 4 and V 5 on EKG from last night.  cardiology consulted. Appreciate  evaluation. Patient started on nitropaste. No further evaluation.  On imdur, coreg, aspirin ECHO with Ef 30 % . Discussed with Dr Wynonia Lawman. Ok to discharge patient today  Needs to follow up with primary cardiology   3-End-stage renal disease Nephrology consultation HD 10-27 Regular schedule for HD MWF.   4-Hypertension  hydralazine 10 mg IV as needed for severe blood pressure Resume coreg, imdur Norvasc.   5-Lymphoma, inactive for 3yrs Monitor CBC  Discharge Diagnoses:  Principal Problem:   Bowel obstruction Active Problems:   ESRD on dialysis (HCC)   HTN (hypertension)   Lymphoma (HCC)   CAD (coronary artery disease)   SBO (small bowel obstruction)   Nonspecific abnormal electrocardiogram (ECG) (EKG)    Discharge Instructions  Discharge Instructions    Diet - low sodium heart healthy    Complete by:  As directed    Increase activity slowly    Complete by:  As directed        Medication List    STOP taking these medications   simethicone 80 MG chewable tablet Commonly known as:  MYLICON     TAKE these medications   acetaminophen 500 MG tablet Commonly known as:  TYLENOL Take 500 mg by mouth daily as needed (pain).   amLODipine 5 MG tablet Commonly known as:  NORVASC Take 5 mg by mouth at bedtime. Hold for systolic BP 0000000   aspirin EC 81 MG tablet Take 81 mg by mouth daily.   carvedilol 12.5 MG tablet Commonly known as:  COREG Take 3.125 mg by mouth 2 (two) times daily. 1/4 tablet   cinacalcet 60 MG tablet Commonly known as:  SENSIPAR Take 60  mg by mouth daily with supper.   DIALYVITE 800 WITH ZINC 0.8 MG Tabs Take 1 tablet by mouth daily with supper.   docusate sodium 100 MG capsule Commonly known as:  COLACE Take 100 mg by mouth 2 (two) times daily.   fluticasone 50 MCG/ACT nasal spray Commonly known as:  FLONASE Place 2 sprays into both nostrils daily as needed (congestion).   hydrOXYzine 25 MG tablet Commonly known as:   ATARAX/VISTARIL Take 25 mg by mouth every 8 (eight) hours as needed for itching.   isosorbide mononitrate 60 MG 24 hr tablet Commonly known as:  IMDUR Take 60 mg by mouth daily.   magnesium oxide 400 MG tablet Commonly known as:  MAG-OX Take 400 mg by mouth 3 (three) times daily with meals.   nitroGLYCERIN 0.4 MG SL tablet Commonly known as:  NITROSTAT Place 0.4 mg under the tongue every 5 (five) minutes as needed for chest pain.   polyethylene glycol packet Commonly known as:  MIRALAX / GLYCOLAX Take 17 g by mouth daily as needed (constipation). Mix in 8 oz of liquid and drink   pravastatin 40 MG tablet Commonly known as:  PRAVACHOL Take 40 mg by mouth at bedtime.   ranitidine 150 MG tablet Commonly known as:  ZANTAC Take 150 mg by mouth See admin instructions. Take 1 tablet (150 mg) by mouth daily before breakfast, may also take a 2nd tablet later in the day as needed for acid reflux   RENVELA 2.4 g Pack Generic drug:  sevelamer carbonate Take 2.4 g by mouth 3 (three) times daily with meals. Mix 1 packet in 2 oz of water and drink       No Known Allergies  Consultations:  Surgery  Cardiology    Procedures/Studies: Dg Abd 2 Views  Result Date: 05/05/2016 CLINICAL DATA:  Small bowel obstruction EXAM: ABDOMEN - 2 VIEW COMPARISON:  05/04/2016 FINDINGS: NG tube is in the proximal stomach. Improving small bowel dilatation. No free air organomegaly. Diffuse vascular calcifications noted. IMPRESSION: Improving small bowel dilatation. Electronically Signed   By: Rolm Baptise M.D.   On: 05/05/2016 08:49   Dg Abd Portable 1v  Result Date: 05/04/2016 CLINICAL DATA:  Abdominal pain BloatedUnable to have bowel movement, symptoms since last nightPatient did have bowel movent an hour agoDialysis patientX smoker 10 yearsHypertensionBypass surgery 10 years ago EXAM: PORTABLE ABDOMEN - 1 VIEW COMPARISON:  CT, 01/14/2009 FINDINGS: There are mildly distended loops of small bowel most  evident in the left central abdomen. Some air and stool is seen within a nondistended colon. There are extensive vascular calcifications. Bilateral iliac artery stents are noted. Nasal/orogastric tube passes below the diaphragm well into the stomach. Soft tissues are otherwise unremarkable. IMPRESSION: 1. Small bowel dilation with no colonic distention. This is consistent with a partial small bowel obstruction. Electronically Signed   By: Lajean Manes M.D.   On: 05/04/2016 13:38      Subjective: Feeling well, had 2 BM in 24 hours.  Tolerating full liquid diet.   Discharge Exam: Vitals:   05/06/16 2100 05/07/16 0632  BP: 113/62 119/63  Pulse: 86 73  Resp: 16 18  Temp: 98.4 F (36.9 C) 98.4 F (36.9 C)   Vitals:   05/06/16 0509 05/06/16 1657 05/06/16 2100 05/07/16 0632  BP: (!) 159/77 127/69 113/62 119/63  Pulse: 82 79 86 73  Resp: 16 16 16 18   Temp: 98.7 F (37.1 C) 99.1 F (37.3 C) 98.4 F (36.9 C) 98.4 F (36.9 C)  TempSrc: Oral Oral Oral Oral  SpO2: 97% 98% 97% 94%  Weight:        General: Pt is alert, awake, not in acute distress Cardiovascular: RRR, S1/S2 +, no rubs, no gallops Respiratory: CTA bilaterally, no wheezing, no rhonchi Abdominal: Soft, NT, ND, bowel sounds + Extremities: no edema, no cyanosis    The results of significant diagnostics from this hospitalization (including imaging, microbiology, ancillary and laboratory) are listed below for reference.     Microbiology: No results found for this or any previous visit (from the past 240 hour(s)).   Labs: BNP (last 3 results) No results for input(s): BNP in the last 8760 hours. Basic Metabolic Panel:  Recent Labs Lab 05/04/16 1607 05/05/16 0405 05/06/16 0027 05/07/16 0420  NA 139 141 138 138  K 5.3* 5.0 4.4 4.2  CL 95* 97* 95* 99*  CO2 21* 27 28 27   GLUCOSE 95 100* 82 98  BUN 41* 49* 26* 46*  CREATININE 10.59* 11.81* 7.89* 11.62*  CALCIUM 10.0 9.7 9.3 8.9  MG 2.6*  --   --   --   PHOS  6.0* 6.1* 5.9*  --    Liver Function Tests:  Recent Labs Lab 05/04/16 1607 05/05/16 0405 05/06/16 0027  AST 23  --   --   ALT 11*  --   --   ALKPHOS 102  --   --   BILITOT 0.6  --   --   PROT 7.3  --   --   ALBUMIN 3.9 3.3* 3.5   No results for input(s): LIPASE, AMYLASE in the last 168 hours. No results for input(s): AMMONIA in the last 168 hours. CBC:  Recent Labs Lab 05/04/16 1607 05/05/16 0405 05/06/16 0027 05/07/16 0420  WBC 8.0 6.7 8.5 5.6  NEUTROABS 5.6  --   --   --   HGB 13.6 12.1* 12.8* 10.7*  HCT 41.9 37.5* 38.8* 33.9*  MCV 103.2* 101.1* 102.4* 101.8*  PLT 193 207 206 181   Cardiac Enzymes:  Recent Labs Lab 05/04/16 2215 05/05/16 0405 05/05/16 1035 05/05/16 1631 05/06/16 0027  TROPONINI 0.04* 0.06* 0.10* 0.28* 0.28*   BNP: Invalid input(s): POCBNP CBG: No results for input(s): GLUCAP in the last 168 hours. D-Dimer No results for input(s): DDIMER in the last 72 hours. Hgb A1c No results for input(s): HGBA1C in the last 72 hours. Lipid Profile No results for input(s): CHOL, HDL, LDLCALC, TRIG, CHOLHDL, LDLDIRECT in the last 72 hours. Thyroid function studies No results for input(s): TSH, T4TOTAL, T3FREE, THYROIDAB in the last 72 hours.  Invalid input(s): FREET3 Anemia work up No results for input(s): VITAMINB12, FOLATE, FERRITIN, TIBC, IRON, RETICCTPCT in the last 72 hours. Urinalysis No results found for: COLORURINE, APPEARANCEUR, LABSPEC, Pepin, GLUCOSEU, HGBUR, BILIRUBINUR, KETONESUR, PROTEINUR, UROBILINOGEN, NITRITE, LEUKOCYTESUR Sepsis Labs Invalid input(s): PROCALCITONIN,  WBC,  LACTICIDVEN Microbiology No results found for this or any previous visit (from the past 240 hour(s)).   Time coordinating discharge: Over 30 minutes  SIGNED:   Elmarie Shiley, MD  [facility] 05/07/2016, 2:34 PM Pager [phone]  If 7PM-7AM, please contact night-coverage [email or web address] Password [email or web address]

## 2016-05-07 NOTE — Progress Notes (Signed)
  Subjective: Tolerating diet.  Had 2 bowel movements.  Denies pain or nausea. He and his family want to go home I think this is safe Discussed with internal medicine and nephrology He can pick up his dialysis in [place] on Monday  Objective: Vital signs in last 24 hours: Temp:  [98.4 F (36.9 C)-99.1 F (37.3 C)] 98.4 F (36.9 C) (10/29 [id number]) Pulse Rate:  [73-86] 73 (10/29 0632) Resp:  [16-18] 18 (10/29 [id number]) BP: (113-127)/(62-69) 119/63 (10/29 [id number]) SpO2:  [94 %-98 %] 94 % (10/29 [id number]) Last BM Date: 05/06/16  Intake/Output from previous day: 10/28 0701 - 10/29 0700 In: 840 [P.O.:840] Out: 1 [Stool:1] Intake/Output this shift: No intake/output data recorded.  General appearance: Alert.  Status normal.  No distress. Resp: clear to auscultation bilaterally GI: soft, non-tender; bowel sounds normal; no masses,  no organomegaly  Lab Results:   Recent Labs  05/06/16 0027 05/07/16 0420  WBC 8.5 5.6  HGB 12.8* 10.7*  HCT 38.8* 33.9*  PLT 206 181   BMET  Recent Labs  05/06/16 0027 05/07/16 0420  NA 138 138  K 4.4 4.2  CL 95* 99*  CO2 28 27  GLUCOSE 82 98  BUN 26* 46*  CREATININE 7.89* 11.62*  CALCIUM 9.3 8.9   PT/INR  Recent Labs  05/04/16 1607  LABPROT 14.8  INR 1.16   ABG No results for input(s): PHART, HCO3 in the last 72 hours.  Invalid input(s): PCO2, PO2  Studies/Results: No results found.  Anti-infectives: Anti-infectives    None      Assessment/Plan:   Partial small bowel obstruction.  This has resolved. Lab work okay He may be discharged from surgical standpoint Follow-up with [facility] if further surgical problems arise  History of appendectomy and PD catheter ESRD on hemodialysis MWF. [place]. Plan hemodialysis MWF Status post CABG 2010 Peripheral vascular disease with right carotid and bilateral iliac stents Hypertension History lymphoma with chemotherapy 1995 Former tobacco abuse.   LOS: 3 days     Jenne Sellinger M 05/07/2016

## 2016-05-07 NOTE — Progress Notes (Signed)
Subjective:  His NG tube is currently out and he feels fine without recurrent angina.  Troponin was slightly elevated.  Echo shows an EF of around 30%.  We don't have an old echo to compare to but the report says his EF was 35-40 before.  He does have occasional angina at times.  Objective:  Vital Signs in the last 24 hours: BP 119/63 (BP Location: Right Arm)   Pulse 73   Temp 98.4 F (36.9 C) (Oral)   Resp 18   Wt 75.2 kg (165 lb 12.6 oz)   SpO2 94%   Physical Exam: Pleasant black male in no acute distress Lungs:  Clear Cardiac:  Regular rhythm, normal S1 and S2, no S3 Abdomen:  Soft, nontender, no masses Extremities:  No edema present  Intake/Output from previous day: 10/28 0701 - 10/29 0700 In: 840 [P.O.:840] Out: 1 [Stool:1]  Weight Filed Weights   05/05/16 1500 05/05/16 1859  Weight: 77.3 kg (170 lb 6.7 oz) 75.2 kg (165 lb 12.6 oz)    Lab Results: Basic Metabolic Panel:  Recent Labs  05/06/16 0027 05/07/16 0420  NA 138 138  K 4.4 4.2  CL 95* 99*  CO2 28 27  GLUCOSE 82 98  BUN 26* 46*  CREATININE 7.89* 11.62*   CBC:  Recent Labs  05/04/16 1607  05/06/16 0027 05/07/16 0420  WBC 8.0  < > 8.5 5.6  NEUTROABS 5.6  --   --   --   HGB 13.6  < > 12.8* 10.7*  HCT 41.9  < > 38.8* 33.9*  MCV 103.2*  < > 102.4* 101.8*  PLT 193  < > 206 181  < > = values in this interval not displayed. Cardiac Enzymes:  Cardiac Panel (last 3 results)  Recent Labs  05/05/16 1035 05/05/16 1631 05/06/16 0027  TROPONINI 0.10* 0.28* 0.28*    Telemetry: Sinus rhythm  Assessment/Plan:  1.  Coronary artery disease with previous bypass grafting with intermittent angina 2.  Demand ischemia 3.  Small bowel obstruction appears to be resolving  Recommendations:  I would restart his home medications and continue to watch him.  If he remains angina free follow-up with his cardiologist in [place].      Kerry Hough  MD [facility]  05/07/2016, 1:44  PM

## 2016-05-07 NOTE — Progress Notes (Signed)
Echocardiogram 2D Echocardiogram has been performed.  Jeremy Kirby 05/07/2016, 8:40 AM

## 2016-05-07 NOTE — Progress Notes (Signed)
Admit: 05/04/2016 LOS: 3  76M ESRD MWF [place] with SBO  Subjective:  NGT out, tol PO, +BM No new issues  10/28 0701 - 10/29 0700 In: 840 [P.O.:840] Out: 1 [Stool:1]  Filed Weights   05/05/16 1500 05/05/16 1859  Weight: 77.3 kg (170 lb 6.7 oz) 75.2 kg (165 lb 12.6 oz)    Scheduled Meds: . amLODipine  5 mg Oral QHS  . aspirin EC  81 mg Oral Daily  . carvedilol  3.125 mg Oral BID  . cinacalcet  60 mg Oral Q supper  . famotidine  20 mg Oral Daily  . isosorbide mononitrate  60 mg Oral Daily  . sevelamer carbonate  2.4 g Oral TID WC   Continuous Infusions:   PRN Meds:.fentaNYL (SUBLIMAZE) injection, hydrALAZINE, LORazepam, nitroGLYCERIN, ondansetron (ZOFRAN) IV **OR** promethazine, phenol  Current Labs: reviewed    Physical Exam:  Blood pressure 119/63, pulse 73, temperature 98.4 F (36.9 C), temperature source Oral, resp. rate 18, weight 75.2 kg (165 lb 12.6 oz), SpO2 94 %. NAD NGT in place RRR abd ND, +BS No LEE AVF LFA +B/T  A 1. ESRD MWF [place] [facility] via L FA AVF 2. SBO with gastric decmopression; surgery following; resolving 3. HTN 4. BMD on sensipar and binders - held while SBO  P 1. HD MWF, tomorrow if still admitted; discharge ok and HD as outpatient tomorrow 2. Req outpt orders   Pearson Grippe MD 05/07/2016, 11:47 AM   Recent Labs Lab 05/04/16 1607 05/05/16 0405 05/06/16 0027 05/07/16 0420  NA 139 141 138 138  K 5.3* 5.0 4.4 4.2  CL 95* 97* 95* 99*  CO2 21* 27 28 27   GLUCOSE 95 100* 82 98  BUN 41* 49* 26* 46*  CREATININE 10.59* 11.81* 7.89* 11.62*  CALCIUM 10.0 9.7 9.3 8.9  PHOS 6.0* 6.1* 5.9*  --     Recent Labs Lab 05/04/16 1607 05/05/16 0405 05/06/16 0027 05/07/16 0420  WBC 8.0 6.7 8.5 5.6  NEUTROABS 5.6  --   --   --   HGB 13.6 12.1* 12.8* 10.7*  HCT 41.9 37.5* 38.8* 33.9*  MCV 103.2* 101.1* 102.4* 101.8*  PLT 193 207 206 181

## 2017-01-30 ENCOUNTER — Encounter (HOSPITAL_COMMUNITY): Payer: Self-pay

## 2017-01-30 ENCOUNTER — Inpatient Hospital Stay (HOSPITAL_COMMUNITY)
Admission: EM | Admit: 2017-01-30 | Discharge: 2017-02-03 | DRG: 291 | Disposition: A | Discharge status: Home / Self Care | Payer: Medicare Other | Attending: Nephrology | Admitting: Nephrology

## 2017-01-30 ENCOUNTER — Emergency Department (HOSPITAL_COMMUNITY): Payer: Medicare Other

## 2017-01-30 ENCOUNTER — Observation Stay (HOSPITAL_COMMUNITY): Payer: Medicare Other

## 2017-01-30 DIAGNOSIS — I25118 Atherosclerotic heart disease of native coronary artery with other forms of angina pectoris: Secondary | ICD-10-CM | POA: Diagnosis present

## 2017-01-30 DIAGNOSIS — E876 Hypokalemia: Secondary | ICD-10-CM | POA: Diagnosis present

## 2017-01-30 DIAGNOSIS — N2581 Secondary hyperparathyroidism of renal origin: Secondary | ICD-10-CM | POA: Diagnosis present

## 2017-01-30 DIAGNOSIS — Z6824 Body mass index (BMI) 24.0-24.9, adult: Secondary | ICD-10-CM

## 2017-01-30 DIAGNOSIS — Z992 Dependence on renal dialysis: Secondary | ICD-10-CM

## 2017-01-30 DIAGNOSIS — G4733 Obstructive sleep apnea (adult) (pediatric): Secondary | ICD-10-CM | POA: Diagnosis present

## 2017-01-30 DIAGNOSIS — I5043 Acute on chronic combined systolic (congestive) and diastolic (congestive) heart failure: Secondary | ICD-10-CM

## 2017-01-30 DIAGNOSIS — I714 Abdominal aortic aneurysm, without rupture: Secondary | ICD-10-CM | POA: Diagnosis present

## 2017-01-30 DIAGNOSIS — K5900 Constipation, unspecified: Secondary | ICD-10-CM | POA: Diagnosis present

## 2017-01-30 DIAGNOSIS — I2510 Atherosclerotic heart disease of native coronary artery without angina pectoris: Secondary | ICD-10-CM | POA: Diagnosis present

## 2017-01-30 DIAGNOSIS — E440 Moderate protein-calorie malnutrition: Secondary | ICD-10-CM | POA: Insufficient documentation

## 2017-01-30 DIAGNOSIS — B370 Candidal stomatitis: Secondary | ICD-10-CM | POA: Diagnosis present

## 2017-01-30 DIAGNOSIS — J189 Pneumonia, unspecified organism: Secondary | ICD-10-CM | POA: Diagnosis present

## 2017-01-30 DIAGNOSIS — J9811 Atelectasis: Secondary | ICD-10-CM | POA: Diagnosis present

## 2017-01-30 DIAGNOSIS — E785 Hyperlipidemia, unspecified: Secondary | ICD-10-CM | POA: Diagnosis not present

## 2017-01-30 DIAGNOSIS — N186 End stage renal disease: Secondary | ICD-10-CM | POA: Diagnosis not present

## 2017-01-30 DIAGNOSIS — I132 Hypertensive heart and chronic kidney disease with heart failure and with stage 5 chronic kidney disease, or end stage renal disease: Principal | ICD-10-CM | POA: Diagnosis present

## 2017-01-30 DIAGNOSIS — J9601 Acute respiratory failure with hypoxia: Secondary | ICD-10-CM | POA: Diagnosis present

## 2017-01-30 DIAGNOSIS — Z79899 Other long term (current) drug therapy: Secondary | ICD-10-CM

## 2017-01-30 DIAGNOSIS — C8590 Non-Hodgkin lymphoma, unspecified, unspecified site: Secondary | ICD-10-CM | POA: Diagnosis present

## 2017-01-30 DIAGNOSIS — Z8572 Personal history of non-Hodgkin lymphomas: Secondary | ICD-10-CM

## 2017-01-30 DIAGNOSIS — Z7982 Long term (current) use of aspirin: Secondary | ICD-10-CM

## 2017-01-30 DIAGNOSIS — R0602 Shortness of breath: Secondary | ICD-10-CM

## 2017-01-30 DIAGNOSIS — Z951 Presence of aortocoronary bypass graft: Secondary | ICD-10-CM

## 2017-01-30 DIAGNOSIS — Z87891 Personal history of nicotine dependence: Secondary | ICD-10-CM

## 2017-01-30 DIAGNOSIS — I10 Essential (primary) hypertension: Secondary | ICD-10-CM | POA: Diagnosis present

## 2017-01-30 DIAGNOSIS — K219 Gastro-esophageal reflux disease without esophagitis: Secondary | ICD-10-CM | POA: Diagnosis present

## 2017-01-30 DIAGNOSIS — D631 Anemia in chronic kidney disease: Secondary | ICD-10-CM | POA: Diagnosis present

## 2017-01-30 HISTORY — DX: Abdominal aortic aneurysm, without rupture, unspecified: I71.40

## 2017-01-30 HISTORY — DX: Unstable angina: I20.0

## 2017-01-30 HISTORY — DX: Abdominal aortic aneurysm, without rupture: I71.4

## 2017-01-30 LAB — BASIC METABOLIC PANEL
Anion gap: 11 (ref 5–15)
BUN: 34 mg/dL — AB (ref 6–20)
CHLORIDE: 98 mmol/L — AB (ref 101–111)
CO2: 29 mmol/L (ref 22–32)
Calcium: 9.5 mg/dL (ref 8.9–10.3)
Creatinine, Ser: 10.6 mg/dL — ABNORMAL HIGH (ref 0.61–1.24)
GFR calc Af Amer: 5 mL/min — ABNORMAL LOW (ref >60)
GFR calc non Af Amer: 4 mL/min — ABNORMAL LOW (ref >60)
GLUCOSE: 96 mg/dL (ref 65–99)
POTASSIUM: 3.4 mmol/L — AB (ref 3.5–5.1)
Sodium: 138 mmol/L (ref 135–145)

## 2017-01-30 LAB — CBC WITH DIFFERENTIAL/PLATELET
Basophils Absolute: 0.1 10*3/uL (ref 0.0–0.1)
Basophils Relative: 1 %
EOS PCT: 5 %
Eosinophils Absolute: 0.3 10*3/uL (ref 0.0–0.7)
HCT: 30.6 % — ABNORMAL LOW (ref 39.0–52.0)
HEMOGLOBIN: 9.9 g/dL — AB (ref 13.0–17.0)
LYMPHS ABS: 1.5 10*3/uL (ref 0.7–4.0)
LYMPHS PCT: 31 %
MCH: 32.1 pg (ref 26.0–34.0)
MCHC: 32.4 g/dL (ref 30.0–36.0)
MCV: 99.4 fL (ref 78.0–100.0)
Monocytes Absolute: 0.2 10*3/uL (ref 0.1–1.0)
Monocytes Relative: 4 %
NEUTROS PCT: 59 %
Neutro Abs: 2.8 10*3/uL (ref 1.7–7.7)
Platelets: 164 10*3/uL (ref 150–400)
RBC: 3.08 MIL/uL — ABNORMAL LOW (ref 4.22–5.81)
RDW: 15.8 % — ABNORMAL HIGH (ref 11.5–15.5)
WBC: 4.9 10*3/uL (ref 4.0–10.5)

## 2017-01-30 LAB — I-STAT CG4 LACTIC ACID, ED: Lactic Acid, Venous: 1.48 mmol/L (ref 0.5–1.9)

## 2017-01-30 MED ORDER — CARVEDILOL 3.125 MG PO TABS
3.1250 mg | ORAL_TABLET | Freq: Two times a day (BID) | ORAL | Status: DC
  Administered 2017-01-31 (×2): 3.125 mg via ORAL
  Filled 2017-01-30 (×2): qty 1

## 2017-01-30 MED ORDER — ALBUTEROL SULFATE (2.5 MG/3ML) 0.083% IN NEBU: 2.5000 mg | INHALATION_SOLUTION | RESPIRATORY_TRACT | Status: DC | PRN

## 2017-01-30 MED ORDER — DM-GUAIFENESIN ER 30-600 MG PO TB12: 1.0000 | ORAL_TABLET | Freq: Two times a day (BID) | ORAL | Status: DC | PRN

## 2017-01-30 MED ORDER — POLYETHYLENE GLYCOL 3350 17 G PO PACK: 17.0000 g | PACK | Freq: Every day | ORAL | Status: DC | PRN

## 2017-01-30 MED ORDER — ACETAMINOPHEN 325 MG PO TABS: 650.0000 mg | ORAL_TABLET | Freq: Four times a day (QID) | ORAL | Status: DC | PRN

## 2017-01-30 MED ORDER — HYDROXYZINE HCL 25 MG PO TABS: 25.0000 mg | ORAL_TABLET | Freq: Three times a day (TID) | ORAL | Status: DC | PRN

## 2017-01-30 MED ORDER — RENA-VITE PO TABS
ORAL_TABLET | Freq: Every day | ORAL | Status: DC
  Administered 2017-01-31 – 2017-02-02 (×3): 1 via ORAL
  Filled 2017-01-30 (×3): qty 1

## 2017-01-30 MED ORDER — DOCUSATE SODIUM 100 MG PO CAPS
100.0000 mg | ORAL_CAPSULE | Freq: Two times a day (BID) | ORAL | Status: DC
  Administered 2017-01-31 – 2017-02-03 (×7): 100 mg via ORAL
  Filled 2017-01-30 (×8): qty 1

## 2017-01-30 MED ORDER — DEXTROSE 5 % IV SOLN
2.0000 g | Freq: Once | INTRAVENOUS | Status: AC
  Administered 2017-01-30: 2 g via INTRAVENOUS
  Filled 2017-01-30: qty 2

## 2017-01-30 MED ORDER — SEVELAMER CARBONATE 2.4 G PO PACK
2.4000 g | PACK | Freq: Three times a day (TID) | ORAL | Status: DC
  Administered 2017-01-31 – 2017-02-03 (×9): 2.4 g via ORAL
  Filled 2017-01-30 (×10): qty 1

## 2017-01-30 MED ORDER — PIPERACILLIN-TAZOBACTAM 3.375 G IVPB
3.3750 g | Freq: Two times a day (BID) | INTRAVENOUS | Status: DC
  Administered 2017-01-31: 3.375 g via INTRAVENOUS
  Filled 2017-01-30: qty 50

## 2017-01-30 MED ORDER — FAMOTIDINE 20 MG PO TABS
20.0000 mg | ORAL_TABLET | Freq: Every day | ORAL | Status: DC
  Administered 2017-01-31 – 2017-02-03 (×4): 20 mg via ORAL
  Filled 2017-01-30 (×4): qty 1

## 2017-01-30 MED ORDER — ZOLPIDEM TARTRATE 5 MG PO TABS: 5.0000 mg | ORAL_TABLET | Freq: Every evening | ORAL | Status: DC | PRN

## 2017-01-30 MED ORDER — POTASSIUM CHLORIDE CRYS ER 20 MEQ PO TBCR: 40.0000 meq | EXTENDED_RELEASE_TABLET | Freq: Once | ORAL | Status: DC

## 2017-01-30 MED ORDER — HEPARIN SODIUM (PORCINE) 5000 UNIT/ML IJ SOLN
5000.0000 [IU] | Freq: Three times a day (TID) | INTRAMUSCULAR | Status: DC
  Administered 2017-01-31 – 2017-02-03 (×9): 5000 [IU] via SUBCUTANEOUS
  Filled 2017-01-30 (×9): qty 1

## 2017-01-30 MED ORDER — AMLODIPINE BESYLATE 5 MG PO TABS
5.0000 mg | ORAL_TABLET | Freq: Every day | ORAL | Status: DC
  Administered 2017-01-31: 5 mg via ORAL
  Filled 2017-01-30: qty 1

## 2017-01-30 MED ORDER — NITROGLYCERIN 0.4 MG SL SUBL
0.4000 mg | SUBLINGUAL_TABLET | SUBLINGUAL | Status: DC | PRN
Start: 2017-01-30 — End: 2017-02-03

## 2017-01-30 MED ORDER — HYDRALAZINE HCL 20 MG/ML IJ SOLN: 5.0000 mg | INTRAMUSCULAR | Status: DC | PRN

## 2017-01-30 MED ORDER — ASPIRIN EC 81 MG PO TBEC
81.0000 mg | DELAYED_RELEASE_TABLET | Freq: Every day | ORAL | Status: DC
  Administered 2017-01-31 – 2017-02-03 (×4): 81 mg via ORAL
  Filled 2017-01-30 (×4): qty 1

## 2017-01-30 MED ORDER — MAGNESIUM OXIDE 400 (241.3 MG) MG PO TABS
400.0000 mg | ORAL_TABLET | Freq: Three times a day (TID) | ORAL | Status: DC
  Administered 2017-01-31 – 2017-02-03 (×10): 400 mg via ORAL
  Filled 2017-01-30 (×10): qty 1

## 2017-01-30 MED ORDER — CINACALCET HCL 30 MG PO TABS
60.0000 mg | ORAL_TABLET | Freq: Every day | ORAL | Status: DC
  Administered 2017-01-31 – 2017-02-02 (×3): 60 mg via ORAL
  Filled 2017-01-30 (×3): qty 2

## 2017-01-30 MED ORDER — IOPAMIDOL (ISOVUE-300) INJECTION 61%
INTRAVENOUS | Status: AC
  Administered 2017-01-30: 75 mL
  Filled 2017-01-30: qty 75

## 2017-01-30 MED ORDER — FLUTICASONE PROPIONATE 50 MCG/ACT NA SUSP
2.0000 | Freq: Every day | NASAL | Status: DC | PRN
  Filled 2017-01-30: qty 16

## 2017-01-30 MED ORDER — ISOSORBIDE MONONITRATE ER 60 MG PO TB24
60.0000 mg | ORAL_TABLET | Freq: Every day | ORAL | Status: DC
  Administered 2017-01-31 – 2017-02-03 (×4): 60 mg via ORAL
  Filled 2017-01-30 (×4): qty 1

## 2017-01-30 MED ORDER — PRAVASTATIN SODIUM 40 MG PO TABS: 40.0000 mg | ORAL_TABLET | Freq: Every day | ORAL | Status: DC

## 2017-01-30 MED ORDER — VANCOMYCIN HCL 10 G IV SOLR
1500.0000 mg | Freq: Once | INTRAVENOUS | Status: AC
  Administered 2017-01-30: 1500 mg via INTRAVENOUS
  Filled 2017-01-30: qty 1500

## 2017-01-30 NOTE — H&P (Addendum)
History and Physical    Ed Mandich EGB:[id number] DOB: 09/07/1950 DOA: 01/30/2017  Referring MD/NP/PA:   PCP: Patient, No Pcp Per   Patient coming from:  The patient is coming from home.  At baseline, pt is independent for most of ADL.   Chief Complaint: Shortness of breath, productive cough  HPI: Jeremy Kirby is a 66 y.o. male with medical history significant of hypertension, hyperlipidemia, GERD, small bowel obstruction, CAD, s/p of CABG, AAA, GI bleeding, lymphoma in remission, who presents with shortness of breath and productive cough.  Patient states that he has been having cough, shortness of breath for almost a month. Patient was treated with antibiotics for 8 days by his renal doctor, with some improvement, but his symptoms have come back in the past several days. Currently he has cough with yellow colored sputum production. Patient does not have chest pain at rest, but coughing induced some chest pain in bilateral lower chest under rib cage. No calf tenderness. Patient does not have fever or chills. Patient has nausea, but no vomiting, diarrhea or abdominal pain. Patient is constipated. Denies symptoms of UTI or unilateral weakness.  ED Course: pt was found to have WBC 4.9, potassium 3.4, bicarbonate of 29, creatinine 10.60, temperature normal, no tachycardia, has tachypnea, oxygen saturation 92-96% on room air. Patient is placed on telemetry bed for observation.  CXR showed: seems have RLL infiltration and also has: 1. Enlargement of cardiac silhouette with pulmonary vascular congestion. 2. RIGHT pleural effusion with suspect loculated fluid in minor fissure/pseudotumor. 3. Bibasilar atelectasis and small LEFT pleural effusion with question minimal perihilar edema.  Review of Systems:   General: no fevers, chills, no changes in body weight, has poor appetite, has fatigue HEENT: no blurry vision, hearing changes or sore throat Respiratory: has dyspnea, coughing, no wheezing CV:  has chest pain, no palpitations GI: has nausea, no vomiting, abdominal pain, diarrhea, constipation GU: no dysuria, burning on urination, increased urinary frequency, hematuria  Ext: no leg edema Neuro: no unilateral weakness, numbness, or tingling, no vision change or hearing loss Skin: no rash, no skin tear. MSK: No muscle spasm, no deformity, no limitation of range of movement in spin Heme: No easy bruising.  Travel history: No recent long distant travel.  Allergy: No Known Allergies  Past Medical History:  Diagnosis Date  . Abdominal aortic aneurysm (AAA) ([place])   . CAD (coronary artery disease) 05/04/2016  . ESRD on dialysis ([place]) 05/04/2016  . GERD (gastroesophageal reflux disease)   . HTN (hypertension) 05/04/2016  . Lymphoma ([place]) 05/04/2016  . SBO (small bowel obstruction) ([place]) 04/2016  . Unstable angina [facility])     Past Surgical History:  Procedure Laterality Date  . ANGIOPLASTY / STENTING FEMORAL    . APPENDECTOMY    . CAROTID STENT    . CORONARY ARTERY BYPASS GRAFT    . ENDARTERECTOMY    . HERNIA REPAIR    . KNEE SURGERY    . RENAL ARTERY STENT    . ROTATOR CUFF REPAIR    . SP AV DIALYSIS SHUNT ACCESS EXISTING *L*      Social History:  reports that he has quit smoking. He has never used smokeless tobacco. He reports that he does not drink alcohol or use drugs.  Family History:  Family History  Problem Relation Age of Onset  . Heart attack Neg Hx      Prior to Admission medications   Medication Sig Start Date End Date Taking? Authorizing Provider  acetaminophen (TYLENOL)  500 MG tablet Take 500 mg by mouth daily as needed (pain).    [provider]  amLODipine (NORVASC) 5 MG tablet Take 5 mg by mouth at bedtime. Hold for systolic BP <161 09/60/45   [provider]  aspirin EC 81 MG tablet Take 81 mg by mouth daily.    [provider]  B Complex-C-Zn-Folic Acid (DIALYVITE 409 WITH ZINC) 0.8 MG TABS Take 1 tablet by mouth daily with  supper.    [provider]  carvedilol (COREG) 12.5 MG tablet Take 3.125 mg by mouth 2 (two) times daily. 1/4 tablet 03/15/16   [provider]  cinacalcet (SENSIPAR) 60 MG tablet Take 60 mg by mouth daily with supper.    [provider]  docusate sodium (COLACE) 100 MG capsule Take 100 mg by mouth 2 (two) times daily.    [provider]  fluticasone (FLONASE) 50 MCG/ACT nasal spray Place 2 sprays into both nostrils daily as needed (congestion).  08/24/13   [provider]  hydrOXYzine (ATARAX/VISTARIL) 25 MG tablet Take 25 mg by mouth every 8 (eight) hours as needed for itching.  03/17/16   [provider]  isosorbide mononitrate (IMDUR) 60 MG 24 hr tablet Take 60 mg by mouth daily. 04/06/16   [provider]  magnesium oxide (MAG-OX) 400 MG tablet Take 400 mg by mouth 3 (three) times daily with meals. 03/02/11   [provider]  nitroGLYCERIN (NITROSTAT) 0.4 MG SL tablet Place 0.4 mg under the tongue every 5 (five) minutes as needed for chest pain.  03/26/16   [provider]  polyethylene glycol (MIRALAX / GLYCOLAX) packet Take 17 g by mouth daily as needed (constipation). Mix in 8 oz of liquid and drink    [provider]  pravastatin (PRAVACHOL) 40 MG tablet Take 40 mg by mouth at bedtime. 04/06/16   [provider]  ranitidine (ZANTAC) 150 MG tablet Take 150 mg by mouth See admin instructions. Take 1 tablet (150 mg) by mouth daily before breakfast, may also take a 2nd tablet later in the day as needed for acid reflux    [provider]  sevelamer carbonate (RENVELA) 2.4 g PACK Take 2.4 g by mouth 3 (three) times daily with meals. Mix 1 packet in 2 oz of water and drink    [provider]    Physical Exam: Vitals:   01/30/17 2330 01/31/17 0027 01/31/17 0051 01/31/17 0455  BP: (!) 149/64 (!) 158/78  (!) 145/92  Pulse: 92 83  83  Resp: (!) 28 (!) 25  15  Temp:  97.7 F (36.5 C)  97.8  F (36.6 C)  TempSrc:  Oral  Oral  SpO2: 92% (!) 83% 93% 100%  Weight:  79.2 kg (174 lb 9.7 oz)    Height:  5\' 11"  (1.803 m)     General: Not in acute distress HEENT:       Eyes: PERRL, EOMI, no scleral icterus.       ENT: No discharge from the ears and nose, no pharynx injection, no tonsillar enlargement.        Neck: No JVD, no bruit, no mass felt. Heme: No neck lymph node enlargement. Cardiac: S1/S2, RRR, No murmurs, No gallops or rubs. Respiratory:  No rales, wheezing, rhonchi or rubs. GI: Soft, nondistended, nontender, no rebound pain, no organomegaly, BS present. GU: No hematuria Ext: No pitting leg edema bilaterally. 2+DP/PT pulse bilaterally. Musculoskeletal: No joint deformities, No joint redness or warmth, no limitation  of ROM in spin. Skin: No rashes.  Neuro: Alert, oriented X3, cranial nerves II-XII grossly intact, moves all extremities normally.  Psych: Patient is not psychotic, no suicidal or hemocidal ideation.  Labs on Admission: I have personally reviewed following labs and imaging studies  CBC:  Recent Labs Lab 01/30/17 2145  WBC 4.9  NEUTROABS 2.8  HGB 9.9*  HCT 30.6*  MCV 99.4  PLT 161   Basic Metabolic Panel:  Recent Labs Lab 01/30/17 2145  NA 138  K 3.4*  CL 98*  CO2 29  GLUCOSE 96  BUN 34*  CREATININE 10.60*  CALCIUM 9.5   GFR: Estimated Creatinine Clearance: 7.3 mL/min (A) (by C-G formula based on SCr of 10.6 mg/dL (H)). Liver Function Tests: No results for input(s): AST, ALT, ALKPHOS, BILITOT, PROT, ALBUMIN in the last 168 hours. No results for input(s): LIPASE, AMYLASE in the last 168 hours. No results for input(s): AMMONIA in the last 168 hours. Coagulation Profile: No results for input(s): INR, PROTIME in the last 168 hours. Cardiac Enzymes: No results for input(s): CKTOTAL, CKMB, CKMBINDEX, TROPONINI in the last 168 hours. BNP (last 3 results) No results for input(s): PROBNP in the last 8760 hours. HbA1C: No results for  input(s): HGBA1C in the last 72 hours. CBG: No results for input(s): GLUCAP in the last 168 hours. Lipid Profile: No results for input(s): CHOL, HDL, LDLCALC, TRIG, CHOLHDL, LDLDIRECT in the last 72 hours. Thyroid Function Tests: No results for input(s): TSH, T4TOTAL, FREET4, T3FREE, THYROIDAB in the last 72 hours. Anemia Panel: No results for input(s): VITAMINB12, FOLATE, FERRITIN, TIBC, IRON, RETICCTPCT in the last 72 hours. Urine analysis: No results found for: COLORURINE, APPEARANCEUR, LABSPEC, PHURINE, GLUCOSEU, HGBUR, BILIRUBINUR, KETONESUR, PROTEINUR, UROBILINOGEN, NITRITE, LEUKOCYTESUR Sepsis Labs: @LABRCNTIP (procalcitonin:4,lacticidven:4) )No results found for this or any previous visit (from the past 240 hour(s)).   Radiological Exams on Admission: Dg Chest 2 View  Result Date: 01/30/2017 CLINICAL DATA:  Shortness of breath for 1 month, hypertension, end-stage renal disease on dialysis, GERD, history lymphoma EXAM: CHEST  2 VIEW COMPARISON:  None FINDINGS: Enlargement of cardiac silhouette with slight vascular congestion. Calcified tortuous thoracic aorta. Mediastinal contours normal. BILATERAL basilar pleural effusions and atelectasis. Large ovoid opacity in the RIGHT mid lung suspect pseudotumor/fluid loculated in the minor fissure. Minimal peribronchial thickening and perihilar saturation on markings, cannot exclude minimal edema. No pneumothorax. Stent identified in the RIGHT cervical region. No acute osseous findings. IMPRESSION: Enlargement of cardiac silhouette with pulmonary vascular congestion. RIGHT pleural effusion with suspect loculated fluid in minor fissure/pseudotumor. Bibasilar atelectasis and small LEFT pleural effusion with question minimal perihilar edema. Aortic Atherosclerosis (ICD10-I70.0) and Emphysema (ICD10-J43.9). Electronically Signed   By: Lavonia Dana M.D.   On: 01/30/2017 15:34   Ct Chest W Contrast  Result Date: 01/31/2017 CLINICAL DATA:  Productive cough  and shortness of breath. EXAM: CT CHEST WITH CONTRAST TECHNIQUE: Multidetector CT imaging of the chest was performed during intravenous contrast administration. CONTRAST:  22mL ISOVUE-300 IOPAMIDOL (ISOVUE-300) INJECTION 61% COMPARISON:  Radiograph earlier this day. FINDINGS: Cardiovascular: Advanced aortic atherosclerosis and tortuosity. Atherosclerosis of major branch vessels. Stent in the right proximal common carotid artery is patent. No dissection. No aneurysm. Post CABG with dense calcifications of the native coronary arteries. Mild cardiomegaly. No pericardial effusion. Mediastinum/Nodes: Shotty mediastinal and right hilar nodes. For example, lower paratracheal node measures 11 mm short axis. Subcarinal node measures 12 mm. Right hilar node measures 12 mm. The esophagus is decompressed. Possible low-density in the right lobe of  the thyroid gland versus artifact from adjacent carotid stent. Lungs/Pleura: There is a loculated right pleural effusion, fluid volume is moderate in degree. Loculated fluid in both the minor and major fissure. Small amount of dependent pleural fluid as well as pleural fluid in the upper right hemithorax. All fluid measures simple fluid density. There is a trace dependent left pleural effusion. No fluid in the left fissure. Moderate septal thickening consistent pulmonary edema. Scattered compressive atelectasis adjacent to pleural effusions. There is bronchial thickening, greatest in the lower lobes. No consolidation to suggest pneumonia. Upper Abdomen: Dense atherosclerosis of upper abdominal vasculature. Polycystic kidney disease partially included. Musculoskeletal: There are no acute or suspicious osseous abnormalities. Degenerative change at C7-T1 is partially included with Modic changes. Post median sternotomy with sternal nonunion. Upper most sternal wire is broken, remaining sternal wires are intact. IMPRESSION: 1. Loculated fluid in the major and minor fissure on the right.  Small probable lower lobe bronchial thickening. Scattered atelectasis adjacent pleural effusions. No evidence of pneumonia. Free-flowing pleural effusions bilaterally, right greater than left. Moderate septal thickening consistent with pulmonary edema. In conjunction with cardiomegaly, consistent with fluid overload/CHF. 2. Shotty mediastinal and right hilar nodes are likely reactive secondary CHF. 3. Advanced aortic atherosclerosis. Coronary artery calcifications post CABG. Aortic Atherosclerosis (ICD10-I70.0). Electronically Signed   By: Jeb Levering M.D.   On: 01/31/2017 00:30     EKG: Independently reviewed.  Sinus rhythm, QTC 503, LAD, diffuse T-wave inversion   Assessment/Plan Active Problems:   ESRD on dialysis ([facility])   HTN (hypertension)   Lymphoma ([facility])   CAD (coronary artery disease)   HLD (hyperlipidemia)   GERD (gastroesophageal reflux disease)   Hypokalemia   HCAP (healthcare-associated pneumonia)   Possible right lower lobar HCAP: CXR seems have RLL infiltration and also has R pleural effusion with suspect loculated fluid in minor fissure/pseudotumor, indicating possible lobar HCAP, but patient does not have fever or leukocytosis. Aspiration pneumonia cannot be completely ruled out given the infiltration is seen in right lower lobe.  - Will place on telemetry bed for obs. - IV Vancomycin and Zosyn (pt received one dose of cefepime a, which was switched to Zosyn to cover possible aspiration pneumonia  - Mucinex for cough  - prn Albuterol Nebs for SOB - Urine legionella and S. pneumococcal antigen - Follow up blood culture x2, sputum culture and respiratory virus panel -will get SLP -will get CT-chest for further evaluation  ESRD on dialysis (MWF): Potassium is 3.4, bicarbonate 29, creatinine 10.60, anion gap 11. Patient had dialysis on Monday. -Left messages to renal box for dialysis -Continue Sensipar and Renvela  HTN: -continue amlodipine, Coreg -IV hydralazine  when necessary  Hx of Lmphoma ([place]): - inactive for more than 20 years  CAD: s/p of CABG. No CP -continue aspirin, pravastatin, Coreg, Imdur -When necessary nitroglycerin  HLD (hyperlipidemia): -Pravastatin  GERD: -Protonix  Mild Hypokalemia: K=3.4 -will f/u by BMP -Will not give potassium now since he is a dialysis patient  DVT ppx: SQ Heparin  Code Status: Full code Family Communication: None at bed side.   Disposition Plan:  Anticipate discharge back to previous home environment Consults called:  none Admission status: Obs / tele    Date of Service 01/31/2017    Ivor Costa [facility] Pager [phone]  If 7PM-7AM, please contact night-coverage [email or web address] Password [email or web address] 01/31/2017, 6:20 AM

## 2017-01-30 NOTE — Progress Notes (Addendum)
Pharmacy Antibiotic Note Jeremy Kirby is a 66 y.o. male admitted on 01/30/2017 with concern for PNA in setting of ESRD. Pharmacy has been consulted for vancomycin dosing. Pt received Cefepime 2 grams x 1 in ED.   Plan: 1. Cefepime 2 grams IV x 1 now 2. Vancomycin 1500 mg IV x 1 now 3. Will follow up on IHD plans and schedule further doses of antibiotics   Height: 5\' 11"  (180.3 cm) Weight: 185 lb (83.9 kg) IBW/kg (Calculated) : 75.3  Temp (24hrs), Avg:98.1 F (36.7 C), Min:98 F (36.7 C), Max:98.2 F (36.8 C)   No Known Allergies   Antimicrobials this admission: 7/24 Cefepime >>  7/24 vancomycin  >>   Microbiology results: Px  Thank you for allowing pharmacy to be a part of this patient's care.  Vincenza Hews, PharmD, BCPS 01/30/2017, 9:50 PM   ____________________________________________________________________________ Addendum:  After speaking with Dr. Blaine Hamper, he would like to start Zosyn for combination aspiration/HCAP coverage. Since the patient received cefepime at ~2200, he asked to start zosyn tomorrow morning.  Zosyn 3.375 IV q12 hours  Georga Bora, PharmD Clinical Pharmacist 01/30/2017 11:18 PM

## 2017-01-30 NOTE — ED Triage Notes (Signed)
Per Pt, Pt is coming from home with complaints of SOB x 1 month. Pt was given antibiotic by Nephrologist that helped and then the SOB and cough with productive yellow sputum.

## 2017-01-30 NOTE — ED Provider Notes (Signed)
[facility] DEPT Provider Note   CSN: [id number] Arrival date & time: 01/30/17  1452     History   Chief Complaint Chief Complaint  Patient presents with  . Shortness of Breath    HPI Jeremy Kirby is a 66 y.o. male.Plains of shortness of breath and productive cough for one month, with thick yellow sputum progressively worsening. Dyspnea is worse with exertion. Associated include chills and subjective fever, and nausea. He has not taken his temperature. He was seen by his to produce a few weeks ago and treated with an antibiotic which did provide any improvement in his symptoms.  HPI  Past Medical History:  Diagnosis Date  . Abdominal aortic aneurysm (AAA) ([place])   . CAD (coronary artery disease) 05/04/2016  . ESRD on dialysis ([place]) 05/04/2016  . GERD (gastroesophageal reflux disease)   . HTN (hypertension) 05/04/2016  . Lymphoma ([place]) 05/04/2016  . SBO (small bowel obstruction) ([place]) 04/2016  . Unstable angina [facility])     Patient Active Problem List   Diagnosis Date Noted  . ESRD on dialysis ([place]) 05/04/2016  . HTN (hypertension) 05/04/2016  . Lymphoma ([place]) 05/04/2016  . CAD (coronary artery disease) 05/04/2016  . Bowel obstruction ([place]) 05/04/2016  . SBO (small bowel obstruction) ([place]) 05/04/2016  . Nonspecific abnormal electrocardiogram (ECG) (EKG) 05/04/2016  . GI bleeding 05/04/2016    Past Surgical History:  Procedure Laterality Date  . ANGIOPLASTY / STENTING FEMORAL    . APPENDECTOMY    . CAROTID STENT    . CORONARY ARTERY BYPASS GRAFT    . ENDARTERECTOMY    . HERNIA REPAIR    . KNEE SURGERY    . RENAL ARTERY STENT    . ROTATOR CUFF REPAIR    . SP AV DIALYSIS SHUNT ACCESS EXISTING *L*         Home Medications    Prior to Admission medications   Medication Sig Start Date End Date Taking? Authorizing Provider  acetaminophen (TYLENOL) 500 MG tablet Take 500 mg by mouth daily as needed (pain).    [provider]  amLODipine (NORVASC) 5 MG  tablet Take 5 mg by mouth at bedtime. Hold for systolic BP <397 67/34/19   [provider]  aspirin EC 81 MG tablet Take 81 mg by mouth daily.    [provider]  B Complex-C-Zn-Folic Acid (DIALYVITE 379 WITH ZINC) 0.8 MG TABS Take 1 tablet by mouth daily with supper.    [provider]  carvedilol (COREG) 12.5 MG tablet Take 3.125 mg by mouth 2 (two) times daily. 1/4 tablet 03/15/16   [provider]  cinacalcet (SENSIPAR) 60 MG tablet Take 60 mg by mouth daily with supper.    [provider]  docusate sodium (COLACE) 100 MG capsule Take 100 mg by mouth 2 (two) times daily.    [provider]  fluticasone (FLONASE) 50 MCG/ACT nasal spray Place 2 sprays into both nostrils daily as needed (congestion).  08/24/13   [provider]  hydrOXYzine (ATARAX/VISTARIL) 25 MG tablet Take 25 mg by mouth every 8 (eight) hours as needed for itching.  03/17/16   [provider]  isosorbide mononitrate (IMDUR) 60 MG 24 hr tablet Take 60 mg by mouth daily. 04/06/16   [provider]  magnesium oxide (MAG-OX) 400 MG tablet Take 400 mg by mouth 3 (three) times daily with meals. 03/02/11   [provider]  nitroGLYCERIN (NITROSTAT) 0.4 MG SL tablet Place 0.4 mg under the tongue every 5 (five)  minutes as needed for chest pain.  03/26/16   [provider]  polyethylene glycol (MIRALAX / GLYCOLAX) packet Take 17 g by mouth daily as needed (constipation). Mix in 8 oz of liquid and drink    [provider]  pravastatin (PRAVACHOL) 40 MG tablet Take 40 mg by mouth at bedtime. 04/06/16   [provider]  ranitidine (ZANTAC) 150 MG tablet Take 150 mg by mouth See admin instructions. Take 1 tablet (150 mg) by mouth daily before breakfast, may also take a 2nd tablet later in the day as needed for acid reflux    [provider]  sevelamer carbonate (RENVELA) 2.4 g PACK Take 2.4 g by mouth 3 (three) times daily with  meals. Mix 1 packet in 2 oz of water and drink    [provider]    Family History Family History  Problem Relation Age of Onset  . Heart attack Neg Hx     Social History Social History  Substance Use Topics  . Smoking status: Former Research scientist (life sciences)  . Smokeless tobacco: Never Used     Comment: QUIT SMOKING IN 2007  . Alcohol use No     Allergies   Patient has no known allergies.   Review of Systems Review of Systems  Respiratory: Positive for cough and shortness of breath.   Gastrointestinal: Positive for nausea.  Allergic/Immunologic: Positive for immunocompromised state.       Hemodialysis patient  All other systems reviewed and are negative.    Physical Exam Updated Vital Signs BP (!) 156/86   Pulse 78   Temp 98.2 F (36.8 C) (Oral)   Resp (!) 23   Ht 5\' 11"  (1.803 m)   Wt 83.9 kg (185 lb)   SpO2 92%   BMI 25.80 kg/m   Physical Exam  Constitutional: He appears well-developed and well-nourished.  HENT:  Head: Normocephalic and atraumatic.  Eyes: Pupils are equal, round, and reactive to light. Conjunctivae are normal.  Neck: Neck supple. No tracheal deviation present. No thyromegaly present.  Cardiovascular: Normal rate and regular rhythm.   No murmur heard. Pulmonary/Chest: Effort normal and breath sounds normal.  Abdominal: Soft. Bowel sounds are normal. He exhibits no distension. There is no tenderness.  Musculoskeletal: Normal range of motion. He exhibits no edema or tenderness.  Neurological: He is alert. Coordination normal.  Skin: Skin is warm and dry. No rash noted.  Psychiatric: He has a normal mood and affect.  Nursing note and vitals reviewed.    ED Treatments / Results  Labs (all labs ordered are listed, but only abnormal results are displayed) Labs Reviewed  CBC WITH DIFFERENTIAL/PLATELET - Abnormal; Notable for the following:       Result Value   RBC 3.08 (*)    Hemoglobin 9.9 (*)    HCT 30.6 (*)    RDW 15.8 (*)    All other  components within normal limits  CULTURE, BLOOD (ROUTINE X 2)  CULTURE, BLOOD (ROUTINE X 2)  BASIC METABOLIC PANEL  I-STAT CG4 LACTIC ACID, ED    EKG  EKG Interpretation  Date/Time:  Tuesday January 30 2017 14:58:55 EDT Ventricular Rate:  84 PR Interval:  164 QRS Duration: 132 QT Interval:  426 QTC Calculation: 503 R Axis:   -22 Text Interpretation:  Normal sinus rhythm Non-specific intra-ventricular conduction block T wave abnormality, consider inferolateral ischemia Abnormal ECG No significant change since last tracing Confirmed by Orlie Dakin [phone]) on 01/30/2017 9:12:58 PM  Radiology Dg Chest 2 View  Result Date: 01/30/2017 CLINICAL DATA:  Shortness of breath for 1 month, hypertension, end-stage renal disease on dialysis, GERD, history lymphoma EXAM: CHEST  2 VIEW COMPARISON:  None FINDINGS: Enlargement of cardiac silhouette with slight vascular congestion. Calcified tortuous thoracic aorta. Mediastinal contours normal. BILATERAL basilar pleural effusions and atelectasis. Large ovoid opacity in the RIGHT mid lung suspect pseudotumor/fluid loculated in the minor fissure. Minimal peribronchial thickening and perihilar saturation on markings, cannot exclude minimal edema. No pneumothorax. Stent identified in the RIGHT cervical region. No acute osseous findings. IMPRESSION: Enlargement of cardiac silhouette with pulmonary vascular congestion. RIGHT pleural effusion with suspect loculated fluid in minor fissure/pseudotumor. Bibasilar atelectasis and small LEFT pleural effusion with question minimal perihilar edema. Aortic Atherosclerosis (ICD10-I70.0) and Emphysema (ICD10-J43.9). Electronically Signed   By: Lavonia Dana M.D.   On: 01/30/2017 15:34    Procedures Procedures (including critical care time)  Medications Ordered in ED Medications  ceFEPIme (MAXIPIME) 2 g in dextrose 5 % 50 mL IVPB (2 g Intravenous New Bag/Given 01/30/17 2202)  vancomycin (VANCOCIN) 1,500 mg in sodium  chloride 0.9 % 500 mL IVPB (not administered)   Chest x-ray viewed by me and discussed with radiologist. Consistent with right middle lobe infiltrate Results for orders placed or performed during the hospital encounter of 01/30/17  CBC with Differential/Platelet  Result Value Ref Range   WBC 4.9 4.0 - 10.5 K/uL   RBC 3.08 (L) 4.22 - 5.81 MIL/uL   Hemoglobin 9.9 (L) 13.0 - 17.0 g/dL   HCT 30.6 (L) 39.0 - 52.0 %   MCV 99.4 78.0 - 100.0 fL   MCH 32.1 26.0 - 34.0 pg   MCHC 32.4 30.0 - 36.0 g/dL   RDW 15.8 (H) 11.5 - 15.5 %   Platelets 164 150 - 400 K/uL   Neutrophils Relative % 59 %   Neutro Abs 2.8 1.7 - 7.7 K/uL   Lymphocytes Relative 31 %   Lymphs Abs 1.5 0.7 - 4.0 K/uL   Monocytes Relative 4 %   Monocytes Absolute 0.2 0.1 - 1.0 K/uL   Eosinophils Relative 5 %   Eosinophils Absolute 0.3 0.0 - 0.7 K/uL   Basophils Relative 1 %   Basophils Absolute 0.1 0.0 - 0.1 K/uL  Basic metabolic panel  Result Value Ref Range   Sodium 138 135 - 145 mmol/L   Potassium 3.4 (L) 3.5 - 5.1 mmol/L   Chloride 98 (L) 101 - 111 mmol/L   CO2 29 22 - 32 mmol/L   Glucose, Bld 96 65 - 99 mg/dL   BUN 34 (H) 6 - 20 mg/dL   Creatinine, Ser 10.60 (H) 0.61 - 1.24 mg/dL   Calcium 9.5 8.9 - 10.3 mg/dL   GFR calc non Af Amer 4 (L) >60 mL/min   GFR calc Af Amer 5 (L) >60 mL/min   Anion gap 11 5 - 15  I-Stat CG4 Lactic Acid, ED  Result Value Ref Range   Lactic Acid, Venous 1.48 0.5 - 1.9 mmol/L   Dg Chest 2 View  Result Date: 01/30/2017 CLINICAL DATA:  Shortness of breath for 1 month, hypertension, end-stage renal disease on dialysis, GERD, history lymphoma EXAM: CHEST  2 VIEW COMPARISON:  None FINDINGS: Enlargement of cardiac silhouette with slight vascular congestion. Calcified tortuous thoracic aorta. Mediastinal contours normal. BILATERAL basilar pleural effusions and atelectasis. Large ovoid opacity in the RIGHT mid lung suspect pseudotumor/fluid loculated in the minor fissure. Minimal peribronchial  thickening and perihilar saturation on markings, cannot  exclude minimal edema. No pneumothorax. Stent identified in the RIGHT cervical region. No acute osseous findings. IMPRESSION: Enlargement of cardiac silhouette with pulmonary vascular congestion. RIGHT pleural effusion with suspect loculated fluid in minor fissure/pseudotumor. Bibasilar atelectasis and small LEFT pleural effusion with question minimal perihilar edema. Aortic Atherosclerosis (ICD10-I70.0) and Emphysema (ICD10-J43.9). Electronically Signed   By: Lavonia Dana M.D.   On: 01/30/2017 15:34   Initial Impression / Assessment and Plan / ED Course  I have reviewed the triage vital signs and the nursing notes.  Pertinent labs & imaging results that were available during my care of the patient were reviewed by me and considered in my medical decision making (see chart for details).      patient has pneumonia with chills, subjective fever, productive cough. We'll treat for healthcare associated pneumonia in light of chronic hemodialysis patient is due for dialysis tomorrow IV antibiotic ordered as well as oral potassium supplementation for hypokalemia. Dr.Niu consulted and will arrange for overnight stay  Final Clinical Impressions(s) / ED Diagnoses  Dx#1 healthcare associated pneumonia #2 hypokalemia Final diagnoses:  None  #3 anemia  New Prescriptions New Prescriptions   No medications on file     Orlie Dakin, MD 01/30/17 2300

## 2017-01-31 ENCOUNTER — Observation Stay (HOSPITAL_BASED_OUTPATIENT_CLINIC_OR_DEPARTMENT_OTHER): Payer: Medicare Other

## 2017-01-31 DIAGNOSIS — I132 Hypertensive heart and chronic kidney disease with heart failure and with stage 5 chronic kidney disease, or end stage renal disease: Secondary | ICD-10-CM | POA: Diagnosis present

## 2017-01-31 DIAGNOSIS — I340 Nonrheumatic mitral (valve) insufficiency: Secondary | ICD-10-CM | POA: Diagnosis not present

## 2017-01-31 DIAGNOSIS — J9811 Atelectasis: Secondary | ICD-10-CM | POA: Diagnosis present

## 2017-01-31 DIAGNOSIS — E440 Moderate protein-calorie malnutrition: Secondary | ICD-10-CM | POA: Diagnosis present

## 2017-01-31 DIAGNOSIS — I10 Essential (primary) hypertension: Secondary | ICD-10-CM

## 2017-01-31 DIAGNOSIS — J189 Pneumonia, unspecified organism: Secondary | ICD-10-CM | POA: Diagnosis not present

## 2017-01-31 DIAGNOSIS — E785 Hyperlipidemia, unspecified: Secondary | ICD-10-CM | POA: Diagnosis present

## 2017-01-31 DIAGNOSIS — K5900 Constipation, unspecified: Secondary | ICD-10-CM | POA: Diagnosis present

## 2017-01-31 DIAGNOSIS — Z951 Presence of aortocoronary bypass graft: Secondary | ICD-10-CM | POA: Diagnosis not present

## 2017-01-31 DIAGNOSIS — D631 Anemia in chronic kidney disease: Secondary | ICD-10-CM | POA: Diagnosis present

## 2017-01-31 DIAGNOSIS — Z79899 Other long term (current) drug therapy: Secondary | ICD-10-CM | POA: Diagnosis not present

## 2017-01-31 DIAGNOSIS — C8590 Non-Hodgkin lymphoma, unspecified, unspecified site: Secondary | ICD-10-CM

## 2017-01-31 DIAGNOSIS — E876 Hypokalemia: Secondary | ICD-10-CM | POA: Diagnosis present

## 2017-01-31 DIAGNOSIS — I714 Abdominal aortic aneurysm, without rupture: Secondary | ICD-10-CM | POA: Diagnosis present

## 2017-01-31 DIAGNOSIS — I351 Nonrheumatic aortic (valve) insufficiency: Secondary | ICD-10-CM

## 2017-01-31 DIAGNOSIS — I5023 Acute on chronic systolic (congestive) heart failure: Secondary | ICD-10-CM | POA: Diagnosis not present

## 2017-01-31 DIAGNOSIS — Z8572 Personal history of non-Hodgkin lymphomas: Secondary | ICD-10-CM | POA: Diagnosis not present

## 2017-01-31 DIAGNOSIS — N186 End stage renal disease: Secondary | ICD-10-CM | POA: Diagnosis present

## 2017-01-31 DIAGNOSIS — J9601 Acute respiratory failure with hypoxia: Secondary | ICD-10-CM | POA: Diagnosis present

## 2017-01-31 DIAGNOSIS — Z6824 Body mass index (BMI) 24.0-24.9, adult: Secondary | ICD-10-CM | POA: Diagnosis not present

## 2017-01-31 DIAGNOSIS — G4733 Obstructive sleep apnea (adult) (pediatric): Secondary | ICD-10-CM | POA: Diagnosis present

## 2017-01-31 DIAGNOSIS — K219 Gastro-esophageal reflux disease without esophagitis: Secondary | ICD-10-CM | POA: Diagnosis present

## 2017-01-31 DIAGNOSIS — I5043 Acute on chronic combined systolic (congestive) and diastolic (congestive) heart failure: Secondary | ICD-10-CM | POA: Diagnosis present

## 2017-01-31 DIAGNOSIS — Z992 Dependence on renal dialysis: Secondary | ICD-10-CM | POA: Diagnosis not present

## 2017-01-31 DIAGNOSIS — Z7982 Long term (current) use of aspirin: Secondary | ICD-10-CM | POA: Diagnosis not present

## 2017-01-31 DIAGNOSIS — B370 Candidal stomatitis: Secondary | ICD-10-CM | POA: Diagnosis present

## 2017-01-31 DIAGNOSIS — Z87891 Personal history of nicotine dependence: Secondary | ICD-10-CM | POA: Diagnosis not present

## 2017-01-31 DIAGNOSIS — R0602 Shortness of breath: Secondary | ICD-10-CM | POA: Diagnosis present

## 2017-01-31 DIAGNOSIS — I25118 Atherosclerotic heart disease of native coronary artery with other forms of angina pectoris: Secondary | ICD-10-CM | POA: Diagnosis present

## 2017-01-31 DIAGNOSIS — N2581 Secondary hyperparathyroidism of renal origin: Secondary | ICD-10-CM | POA: Diagnosis present

## 2017-01-31 LAB — ECHOCARDIOGRAM COMPLETE
HEIGHTINCHES: 71 in
WEIGHTICAEL: 2793.67 [oz_av]

## 2017-01-31 LAB — RESPIRATORY PANEL BY PCR
Adenovirus: NOT DETECTED
BORDETELLA PERTUSSIS-RVPCR: NOT DETECTED
CORONAVIRUS 229E-RVPPCR: NOT DETECTED
CORONAVIRUS OC43-RVPPCR: NOT DETECTED
Chlamydophila pneumoniae: NOT DETECTED
Coronavirus HKU1: NOT DETECTED
Coronavirus NL63: NOT DETECTED
INFLUENZA B-RVPPCR: NOT DETECTED
Influenza A: NOT DETECTED
METAPNEUMOVIRUS-RVPPCR: NOT DETECTED
Mycoplasma pneumoniae: NOT DETECTED
PARAINFLUENZA VIRUS 1-RVPPCR: NOT DETECTED
PARAINFLUENZA VIRUS 2-RVPPCR: NOT DETECTED
PARAINFLUENZA VIRUS 3-RVPPCR: NOT DETECTED
PARAINFLUENZA VIRUS 4-RVPPCR: NOT DETECTED
RESPIRATORY SYNCYTIAL VIRUS-RVPPCR: NOT DETECTED
RHINOVIRUS / ENTEROVIRUS - RVPPCR: NOT DETECTED

## 2017-01-31 LAB — MRSA PCR SCREENING: MRSA BY PCR: NEGATIVE

## 2017-01-31 LAB — HIV ANTIBODY (ROUTINE TESTING W REFLEX): HIV Screen 4th Generation wRfx: NONREACTIVE

## 2017-01-31 MED ORDER — PENTAFLUOROPROP-TETRAFLUOROETH EX AERO: 1.0000 "application " | INHALATION_SPRAY | CUTANEOUS | Status: DC | PRN

## 2017-01-31 MED ORDER — SODIUM CHLORIDE 0.9 % IV SOLN: 100.0000 mL | INTRAVENOUS | Status: DC | PRN

## 2017-01-31 MED ORDER — PRAVASTATIN SODIUM 20 MG PO TABS
20.0000 mg | ORAL_TABLET | Freq: Every day | ORAL | Status: DC
  Administered 2017-01-31 – 2017-02-02 (×3): 20 mg via ORAL
  Filled 2017-01-31 (×3): qty 1

## 2017-01-31 MED ORDER — LIDOCAINE-PRILOCAINE 2.5-2.5 % EX CREA: 1.0000 "application " | TOPICAL_CREAM | CUTANEOUS | Status: DC | PRN

## 2017-01-31 MED ORDER — LIDOCAINE HCL (PF) 1 % IJ SOLN: 5.0000 mL | INTRAMUSCULAR | Status: DC | PRN

## 2017-01-31 MED ORDER — HEPARIN SODIUM (PORCINE) 1000 UNIT/ML DIALYSIS: 1000.0000 [IU] | INTRAMUSCULAR | Status: DC | PRN

## 2017-01-31 MED ORDER — NEPRO/CARBSTEADY PO LIQD
237.0000 mL | Freq: Two times a day (BID) | ORAL | Status: DC
  Administered 2017-01-31 – 2017-02-03 (×4): 237 mL via ORAL

## 2017-01-31 MED ORDER — DOXERCALCIFEROL 4 MCG/2ML IV SOLN
1.0000 ug | INTRAVENOUS | Status: DC
  Administered 2017-02-02: 1 ug via INTRAVENOUS
  Filled 2017-01-31: qty 2

## 2017-01-31 MED ORDER — CARVEDILOL 6.25 MG PO TABS
6.2500 mg | ORAL_TABLET | Freq: Two times a day (BID) | ORAL | Status: DC
  Administered 2017-01-31 – 2017-02-03 (×6): 6.25 mg via ORAL
  Filled 2017-01-31 (×6): qty 1

## 2017-01-31 MED ORDER — ALTEPLASE 2 MG IJ SOLR: 2.0000 mg | Freq: Once | INTRAMUSCULAR | Status: DC | PRN

## 2017-01-31 NOTE — Progress Notes (Signed)
Pt has his HOME unit CPAP machine. No help needed. RN aware. Instructed to call RT if assistance is needed with the machine.

## 2017-01-31 NOTE — Care Management Obs Status (Signed)
[facility] NOTIFICATION   Patient Details  Name: Berwyn Bigley MRN: [id number] Date of Birth: [birth year]   Medicare Observation Status Notification Given:  Yes    Carles Collet, RN 01/31/2017, 4:03 PM

## 2017-01-31 NOTE — Consult Note (Signed)
Referring Provider: No ref. provider found Primary Care Physician:  Jeremy Kirby, No Pcp Per Primary Nephrologist:    Reason for Consultation:  Medical management of ESRD  Anemia and secondary hyperparathyroidism  HPI:   66 y.o. male with medical history significant of hypertension, hyperlipidemia, GERD, small bowel obstruction, CAD, s/p of CABG, AAA, GI bleeding, lymphoma in remission, who presents with shortness of breath and productive and a right infiltrate that is thought to be pneumonia. He is being treated with vancomycin and zosyn. He receives dialysis MWF, his blood pressure looks fairly well controlled and dialyzes with a fistula.  He had no increase in white count and is not febrile. I wonder if the infiltrate is related to volume overload and needs challenging of his dry weight. He denies any cardiac symptoms and has had a CABG in the past   Past Medical History:  Diagnosis Date  . Abdominal aortic aneurysm (AAA) ([place])   . CAD (coronary artery disease) 05/04/2016  . ESRD on dialysis ([place]) 05/04/2016  . GERD (gastroesophageal reflux disease)   . HTN (hypertension) 05/04/2016  . Lymphoma ([place]) 05/04/2016  . SBO (small bowel obstruction) ([place]) 04/2016  . Unstable angina [facility])     Past Surgical History:  Procedure Laterality Date  . ANGIOPLASTY / STENTING FEMORAL    . APPENDECTOMY    . CAROTID STENT    . CORONARY ARTERY BYPASS GRAFT    . ENDARTERECTOMY    . HERNIA REPAIR    . KNEE SURGERY    . RENAL ARTERY STENT    . ROTATOR CUFF REPAIR    . SP AV DIALYSIS SHUNT ACCESS EXISTING *L*      Prior to Admission medications   Medication Sig Start Date End Date Taking? Authorizing Provider  acetaminophen (TYLENOL) 500 MG tablet Take 500 mg by mouth daily as needed (pain).   Yes [provider]  amLODipine (NORVASC) 5 MG tablet Take 5 mg by mouth at bedtime. Hold for systolic BP <659 93/57/01  Yes [provider]  aspirin EC 81 MG tablet Take 81 mg by mouth  daily.   Yes [provider]  B Complex-C-Zn-Folic Acid (DIALYVITE 779 WITH ZINC) 0.8 MG TABS Take 1 tablet by mouth daily with supper.   Yes [provider]  carvedilol (COREG) 3.125 MG tablet Take 3.125 mg by mouth 2 (two) times daily with a meal.   Yes [provider]  cinacalcet (SENSIPAR) 60 MG tablet Take 60 mg by mouth daily with supper.   Yes [provider]  docusate sodium (COLACE) 100 MG capsule Take 100 mg by mouth 2 (two) times daily.   Yes [provider]  fluticasone (FLONASE) 50 MCG/ACT nasal spray Place 2 sprays into both nostrils daily as needed (congestion).  08/24/13  Yes [provider]  hydrOXYzine (ATARAX/VISTARIL) 25 MG tablet Take 25 mg by mouth every 8 (eight) hours as needed for itching.  03/17/16  Yes [provider]  isosorbide mononitrate (IMDUR) 60 MG 24 hr tablet Take 60 mg by mouth daily. 04/06/16  Yes [provider]  magnesium oxide (MAG-OX) 400 MG tablet Take 400 mg by mouth 3 (three) times daily with meals. 03/02/11  Yes [provider]  nitroGLYCERIN (NITROSTAT) 0.4 MG SL tablet Place 0.4 mg under the tongue every 5 (five) minutes as needed for chest pain.  03/26/16  Yes [provider]  polyethylene glycol (MIRALAX / GLYCOLAX) packet Take 17 g by mouth daily as needed (constipation). Mix in 8  oz of liquid and drink   Yes [provider]  pravastatin (PRAVACHOL) 40 MG tablet Take 40 mg by mouth at bedtime. 04/06/16  Yes [provider]  ranitidine (ZANTAC) 150 MG tablet Take 150 mg by mouth See admin instructions. Take 1 tablet (150 mg) by mouth daily before breakfast, may also take a 2nd tablet later in the day as needed for acid reflux   Yes [provider]  sevelamer carbonate (RENVELA) 2.4 g PACK Take 2.4 g by mouth 3 (three) times daily with meals. Mix 1 packet in 2 oz of water and drink   Yes [provider]    Current Facility-Administered  Medications  Medication Dose Route Frequency Provider Last Rate Last Dose  . acetaminophen (TYLENOL) tablet 650 mg  650 mg Oral Q6H PRN Ivor Costa, MD      . albuterol (PROVENTIL) (2.5 MG/3ML) 0.083% nebulizer solution 2.5 mg  2.5 mg Nebulization Q4H PRN Ivor Costa, MD      . amLODipine (NORVASC) tablet 5 mg  5 mg Oral QHS Ivor Costa, MD   5 mg at 01/31/17 0134  . aspirin EC tablet 81 mg  81 mg Oral Daily Ivor Costa, MD   81 mg at 01/31/17 4259  . carvedilol (COREG) tablet 3.125 mg  3.125 mg Oral BID Ivor Costa, MD   3.125 mg at 01/31/17 0926  . cinacalcet (SENSIPAR) tablet 60 mg  60 mg Oral Q supper Ivor Costa, MD      . dext[facility]ethorphan[facility] DM) 30-600 MG per 12 hr tablet 1 tablet  1 tablet Oral BID PRN Ivor Costa, MD      . docusate sodium (COLACE) capsule 100 mg  100 mg Oral BID Ivor Costa, MD   100 mg at 01/31/17 0926  . doxercalciferol (HECTOROL) injection 1 mcg  1 mcg Intravenous Q M,W,F-HD Ejigiri, Thomos Lemons, PA-C      . famotidine (PEPCID) tablet 20 mg  20 mg Oral Daily Ivor Costa, MD   20 mg at 01/31/17 0926  . feeding supplement (NEPRO CARB STEADY) liquid 237 mL  237 mL Oral BID BM Ivor Costa, MD      . fluticasone (FLONASE) 50 MCG/ACT nasal spray 2 spray  2 spray Each Nare Daily PRN Ivor Costa, MD      . heparin injection 5,000 Units  5,000 Units Subcutaneous Q8H Ivor Costa, MD   5,000 Units at 01/31/17 [phone]  . hydrALAZINE (APRESOLINE) injection 5 mg  5 mg Intravenous Q2H PRN Ivor Costa, MD      . hydrOXYzine (ATARAX/VISTARIL) tablet 25 mg  25 mg Oral Q8H PRN Ivor Costa, MD      . isosorbide mononitrate (IMDUR) 24 hr tablet 60 mg  60 mg Oral Daily Ivor Costa, MD   60 mg at 01/31/17 0926  . magnesium oxide (MAG-OX) tablet 400 mg  400 mg Oral TID WC Ivor Costa, MD   400 mg at 01/31/17 0857  . multivitamin (RENA-VIT) tablet   Oral Q supper Ivor Costa, MD      . nitroGLYCERIN (NITROSTAT) SL tablet 0.4 mg  0.4 mg Sublingual Q5 min PRN Ivor Costa, MD      .  piperacillin-tazobactam (ZOSYN) IVPB 3.375 g  3.375 g Intravenous Q12H Rudisill, Jodean Lima, RPH 12.5 mL/hr at 01/31/17 0650 3.375 g at 01/31/17 0650  . polyethylene glycol (MIRALAX / GLYCOLAX) packet 17 g  17 g Oral Daily PRN Ivor Costa, MD      . pravastatin (PRAVACHOL) tablet 20  mg  20 mg Oral QHS Ivor Costa, MD      . sevelamer carbonate (RENVELA) powder PACK 2.4 g  2.4 g Oral TID WC Ivor Costa, MD      . zolpidem (AMBIEN) tablet 5 mg  5 mg Oral QHS PRN Ivor Costa, MD        Allergies as of 01/30/2017  . (No Known Allergies)    Family History  Problem Relation Age of Onset  . Heart attack Neg Hx     Social History   Social History  . Marital status: Married    Spouse name: N/A  . Number of children: N/A  . Years of education: N/A   Occupational History  . Not on file.   Social History Main Topics  . Smoking status: Former Research scientist (life sciences)  . Smokeless tobacco: Never Used     Comment: QUIT SMOKING IN 2007  . Alcohol use No  . Drug use: No  . Sexual activity: Not on file   Other Topics Concern  . Not on file   Social History Narrative  . No narrative on file    Review of Systems: Gen: Denies any fever, chills, sweats, anorexia, fatigue, weakness, malaise, weight loss, and sleep disorder HEENT: No visual complaints, No history of Retinopathy. Normal external appearance No Epistaxis or Sore throat. No sinusitis.   CV: Denies chest pain, angina, palpitations, syncope, orthopnea, PND, peripheral edema, and claudication. Resp: Denies dyspnea at rest, dyspnea with exercise, cough, sputum, wheezing, coughing up blood, and pleurisy. GI: Denies vomiting blood, jaundice, and fecal incontinence.   Denies dysphagia or odynophagia. GU : Denies urinary burning, blood in urine, urinary frequency, urinary hesitancy, nocturnal urination, and urinary incontinence.  No renal calculi. MS: Denies joint pain, limitation of movement, and swelling, stiffness, low back pain, extremity pain. Denies muscle  weakness, cramps, atrophy.  No use of non steroidal antiinflammatory drugs. Derm: Denies rash, itching, dry skin, hives, moles, warts, or unhealing ulcers.  Psych: Denies depression, anxiety, memory loss, suicidal ideation, hallucinations, paranoia, and confusion. Heme: Denies bruising, bleeding, and enlarged lymph nodes. Neuro: No headache.  No diplopia. No dysarthria.  No dysphasia.  No history of CVA.  No Seizures. No paresthesias.  No weakness. Endocrine No DM.  No Thyroid disease.  No Adrenal disease.  Physical Exam: Vital signs in last 24 hours: Temp:  [97.7 F (36.5 C)-98.2 F (36.8 C)] 98.2 F (36.8 C) (07/25 1031) Pulse Rate:  [78-93] 86 (07/25 1031) Resp:  [15-28] 18 (07/25 1031) BP: (136-167)/(64-92) 144/70 (07/25 1031) SpO2:  [83 %-100 %] 98 % (07/25 1031) Weight:  [174 lb 9.7 oz (79.2 kg)-185 lb (83.9 kg)] 174 lb 9.7 oz (79.2 kg) (07/25 0027) Last BM Date: 01/30/17 General:   Alert,  Well-developed, well-nourished, pleasant and cooperative in NAD Head:  Normocephalic and atraumatic. Eyes:  Sclera clear, no icterus.   Conjunctiva pink. Ears:  Normal auditory acuity. Nose:  No deformity, discharge,  or lesions. Mouth:  No deformity or lesions, dentition normal. Neck:  Supple; no masses or thyromegaly. JVP not elevated Lungs:  Clear throughout to auscultation.   No wheezes, crackles, or rhonchi. No acute distress. Heart:  Regular rate and rhythm; no murmurs, clicks, rubs,  or gallops. Abdomen:  Soft, nontender and nondistended. No masses, hepatosplenomegaly or hernias noted. Normal bowel sounds, without guarding, and without rebound.   Msk:  Symmetrical without gross deformities. Normal posture. Pulses:  No carotid, renal, femoral bruits. DP and PT symmetrical and equal Extremities:  Without  clubbing or edema. Neurologic:  Alert and  oriented x4;  grossly normal neurologically. Skin:  Intact without significant lesions or rashes. Cervical Nodes:  No significant cervical  adenopathy. Psych:  Alert and cooperative. Normal mood and affect.  Intake/Output from previous day: 07/24 0701 - 07/25 0700 In: 50 [IV Piggyback:50] Out: 0  Intake/Output this shift: Total I/O In: 240 [P.O.:240] Out: 0   Lab Results:  Recent Labs  01/30/17 2145  WBC 4.9  HGB 9.9*  HCT 30.6*  PLT 164   BMET  Recent Labs  01/30/17 2145  NA 138  K 3.4*  CL 98*  CO2 29  GLUCOSE 96  BUN 34*  CREATININE 10.60*  CALCIUM 9.5   LFT No results for input(s): PROT, ALBUMIN, AST, ALT, ALKPHOS, BILITOT, BILIDIR, IBILI in the last 72 hours. PT/INR No results for input(s): LABPROT, INR in the last 72 hours. Hepatitis Panel No results for input(s): HEPBSAG, HCVAB, HEPAIGM, HEPBIGM in the last 72 hours.  Studies/Results: Dg Chest 2 View  Result Date: 01/30/2017 CLINICAL DATA:  Shortness of breath for 1 month, hypertension, end-stage renal disease on dialysis, GERD, history lymphoma EXAM: CHEST  2 VIEW COMPARISON:  None FINDINGS: Enlargement of cardiac silhouette with slight vascular congestion. Calcified tortuous thoracic aorta. Mediastinal contours normal. BILATERAL basilar pleural effusions and atelectasis. Large ovoid opacity in the RIGHT mid lung suspect pseudotumor/fluid loculated in the minor fissure. Minimal peribronchial thickening and perihilar saturation on markings, cannot exclude minimal edema. No pneumothorax. Stent identified in the RIGHT cervical region. No acute osseous findings. IMPRESSION: Enlargement of cardiac silhouette with pulmonary vascular congestion. RIGHT pleural effusion with suspect loculated fluid in minor fissure/pseudotumor. Bibasilar atelectasis and small LEFT pleural effusion with question minimal perihilar edema. Aortic Atherosclerosis (ICD10-I70.0) and Emphysema (ICD10-J43.9). Electronically Signed   By: Lavonia Dana M.D.   On: 01/30/2017 15:34   Ct Chest W Contrast  Result Date: 01/31/2017 CLINICAL DATA:  Productive cough and shortness of breath.  EXAM: CT CHEST WITH CONTRAST TECHNIQUE: Multidetector CT imaging of the chest was performed during intravenous contrast administration. CONTRAST:  64mL ISOVUE-300 IOPAMIDOL (ISOVUE-300) INJECTION 61% COMPARISON:  Radiograph earlier this day. FINDINGS: Cardiovascular: Advanced aortic atherosclerosis and tortuosity. Atherosclerosis of major branch vessels. Stent in the right proximal common carotid artery is patent. No dissection. No aneurysm. Post CABG with dense calcifications of the native coronary arteries. Mild cardiomegaly. No pericardial effusion. Mediastinum/Nodes: Shotty mediastinal and right hilar nodes. For example, lower paratracheal node measures 11 mm short axis. Subcarinal node measures 12 mm. Right hilar node measures 12 mm. The esophagus is decompressed. Possible low-density in the right lobe of the thyroid gland versus artifact from adjacent carotid stent. Lungs/Pleura: There is a loculated right pleural effusion, fluid volume is moderate in degree. Loculated fluid in both the minor and major fissure. Small amount of dependent pleural fluid as well as pleural fluid in the upper right hemithorax. All fluid measures simple fluid density. There is a trace dependent left pleural effusion. No fluid in the left fissure. Moderate septal thickening consistent pulmonary edema. Scattered compressive atelectasis adjacent to pleural effusions. There is bronchial thickening, greatest in the lower lobes. No consolidation to suggest pneumonia. Upper Abdomen: Dense atherosclerosis of upper abdominal vasculature. Polycystic kidney disease partially included. Musculoskeletal: There are no acute or suspicious osseous abnormalities. Degenerative change at C7-T1 is partially included with Modic changes. Post median sternotomy with sternal nonunion. Upper most sternal wire is broken, remaining sternal wires are intact. IMPRESSION: 1. Loculated fluid in the major  and minor fissure on the right. Small probable lower lobe  bronchial thickening. Scattered atelectasis adjacent pleural effusions. No evidence of pneumonia. Free-flowing pleural effusions bilaterally, right greater than left. Moderate septal thickening consistent with pulmonary edema. In conjunction with cardiomegaly, consistent with fluid overload/CHF. 2. Shotty mediastinal and right hilar nodes are likely reactive secondary CHF. 3. Advanced aortic atherosclerosis. Coronary artery calcifications post CABG. Aortic Atherosclerosis (ICD10-I70.0). Electronically Signed   By: Jeb Levering M.D.   On: 01/31/2017 00:30    Assessment/Plan:  ESRD- MWF dialysis will continue the regular schedule  As per [place]- Hb 9.9  Will evaluate ESA  MBD- follow calcium and phosphorus and adjust vitamin D  HTN/VOL- challenge EDW   ACCESS- AVF with thrill appears to be functioning well  Will review records from outpatient center   LOS: 0 Rebeka Kimble W @TODAY @11 :10 AM

## 2017-01-31 NOTE — Evaluation (Signed)
Clinical/Bedside Swallow Evaluation Patient Details  Name: Jeremy Kirby MRN: [id number] Date of Birth: [birth year]  Today's Date: 01/31/2017 Time: SLP Start Time (ACUTE ONLY): 0947 SLP Stop Time (ACUTE ONLY): 0957 SLP Time Calculation (min) (ACUTE ONLY): 10 min  Past Medical History:  Past Medical History:  Diagnosis Date  . Abdominal aortic aneurysm (AAA) ([place])   . CAD (coronary artery disease) 05/04/2016  . ESRD on dialysis ([place]) 05/04/2016  . GERD (gastroesophageal reflux disease)   . HTN (hypertension) 05/04/2016  . Lymphoma ([place]) 05/04/2016  . SBO (small bowel obstruction) ([place]) 04/2016  . Unstable angina [facility])    Past Surgical History:  Past Surgical History:  Procedure Laterality Date  . ANGIOPLASTY / STENTING FEMORAL    . APPENDECTOMY    . CAROTID STENT    . CORONARY ARTERY BYPASS GRAFT    . ENDARTERECTOMY    . HERNIA REPAIR    . KNEE SURGERY    . RENAL ARTERY STENT    . ROTATOR CUFF REPAIR    . SP AV DIALYSIS SHUNT ACCESS EXISTING *L*     HPI:  Jeremy Kirby a 66 y.o.malewith medical history significant of hypertension, hyperlipidemia, GERD, ESRD, small bowel obstruction, CAD, s/p of CABG, AAA, GI bleeding, lymphomain remission, who presents with shortness of breath and productive cough. Chest CT loculated fluid in the major and minor fissure on the right, small probable lower lobe bronchial thickening. Scattered atelectasis adjacent pleural effusions. No evidence of pneumonia, moderate septal thickening consistent with pulmonary edema. In conjunction with cardiomegaly, consistent with fluid overload/CHF.   Assessment / Plan / Recommendation Clinical Impression  Pt reports intermittent pharyngeal globus sensation verus odonophagia and excess mucous. He affirms heartburn and has dx of GERD. Immediate throat clear with this via straw x 1 prevented with cup sips during this assessment. Oropharyngeal swallow function within normal limits. Educated pt symptoms may be due to  his reflux. Educated pt/wife on strategies to mitigate reflux symptoms. Continue regular texture, thin liquids. No further ST needed.  SLP Visit Diagnosis: Dysphagia, unspecified (R13.10)    Aspiration Risk  Mild aspiration risk    Diet Recommendation Regular;Thin liquid   Liquid Administration via: Cup;Straw (cup sip if difficulty returns with straw) Medication Administration: Whole meds with liquid Supervision: Patient able to self feed Compensations: Follow solids with liquid Postural Changes: Seated upright at 90 degrees;Remain upright for at least 30 minutes after po intake    Other  Recommendations Oral Care Recommendations: Oral care BID   Follow up Recommendations None      Frequency and Duration            Prognosis        Swallow Study   General HPI: Jeremy Kirby a 66 y.o.malewith medical history significant of hypertension, hyperlipidemia, GERD, ESRD, small bowel obstruction, CAD, s/p of CABG, AAA, GI bleeding, lymphomain remission, who presents with shortness of breath and productive cough. Chest CT loculated fluid in the major and minor fissure on the right, small probable lower lobe bronchial thickening. Scattered atelectasis adjacent pleural effusions. No evidence of pneumonia, moderate septal thickening consistent with pulmonary edema. In conjunction with cardiomegaly, consistent with fluid overload/CHF. Type of Study: Bedside Swallow Evaluation Previous Swallow Assessment: none Diet Prior to this Study: Regular;Thin liquids Temperature Spikes Noted: No Respiratory Status: Room air History of Recent Intubation: No Behavior/Cognition: Alert;Cooperative;Pleasant mood Oral Cavity Assessment: Within Functional Limits Oral Care Completed by SLP: No Oral Cavity - Dentition: Adequate natural dentition Vision: Functional for self-feeding Self-Feeding Abilities:  Able to feed self Patient Positioning: Upright in bed Baseline Vocal Quality: Normal Volitional  Cough: Strong Volitional Swallow: Able to elicit    Oral/Motor/Sensory Function Overall Oral Motor/Sensory Function: Within functional limits   Ice Chips Ice chips: Not tested   Thin Liquid Thin Liquid: Impaired Presentation: Straw;Cup Oral Phase Impairments:  (none) Oral Phase Functional Implications:  (none) Pharyngeal  Phase Impairments: Throat Clearing - Immediate (with straw)    Nectar Thick Nectar Thick Liquid: Not tested   Honey Thick Honey Thick Liquid: Not tested   Puree Puree: Within functional limits   Solid   GO   Solid: Within functional limits        Houston Siren 01/31/2017,10:09 AM  Orbie Pyo Colvin Caroli.Ed [employer] [phone]

## 2017-01-31 NOTE — Progress Notes (Signed)
New Admission Note: Pt admitted to 6E23 from [place]: via stretcher Mental Orientation: alert and oriented x 4 Telemetry: initiated per order Assessment: Completed Skin: Intact IV: R AC Pain: denies Tubes: None Safety Measures: Safety Fall Prevention Plan has been discussed  Admission: to be completed [address] [place] Orientation: Patient has been orientated to the room, unit and staff.  Family: Spouse at the bedside  Orders to be reviewed and implemented. Will continue to monitor the patient. Call light has been placed within reach and bed alarm has been activated.   Mady Gemma, BSN, RN-BC Phone: [phone]

## 2017-01-31 NOTE — Progress Notes (Signed)
  Echocardiogram 2D Echocardiogram has been performed.  Jeremy Kirby M 01/31/2017, 1:44 PM

## 2017-01-31 NOTE — Progress Notes (Addendum)
[facility] PROGRESS NOTE  Garrette Caine WJX:[id number] DOB: 11-28-1950 DOA: 01/30/2017 PCP: Patient, No Pcp Per  Interim summary and HPI 66 y.o. male with medical history significant of hypertension, hyperlipidemia, GERD, small bowel obstruction, CAD, s/p of CABG, AAA, GI bleeding, lymphoma in remission, who presents with shortness of breath and dry cough. No fever, no leukocytosis. Symptoms present for over a month and slowly progressing.  Assessment/Plan: 1-acute resp failure with hypoxia: just recently started on oxygen; due to acute on chronic systolic HF -patient symptoms present for over a month now and worsening -no fever, no leukocytosis -no productive and CT scan essentially ruling out PNA -symptoms most likely explained by acute on chronic systolic HF -will stop abx;s and monitor patient off them -will follow daily weight, strict I's and O's and with assistance from renal service will increase volume removal. Patient will benefit of back to back HD and decrease in his dry weight once acute exacerbation controlled. -will also continue (coreg, at Adjusted ose now), stop norvasc and continue isosorbide -if BP remains stable to slightly up with add low dose hydralazine  2-essential HTN -to be control with coreg, isosorbide and for now PRN hydralazine -will follow VS -decrease volume from HD will also help  3- ESRD -continue HD -renal on board -looking to have more frequent treatments to addressed vascular congestion and pulmonary edema  4-hx of lymphoma -stable -condition has been incative for over 20 years now  5-HLD -continue pravachol  6-GERD -continue PPI  7-hypokalemia -K 3.4 -to be corrected with HD -will monitor   8-moderate protein calorie malnutrition -will follow nutritional service rec's -use feeding supplements -patient report poor appetite   9-OSA -contiue CPAP QHS  Code Status: Full Family Communication: wife at bedside  Disposition Plan:  will stop antibiotics, increase dose of coreg, continue isosorbide and with nephrology help improve volume status. Patient SOB due to acute on chronic systolic HF. Will stop norvasc.   Consultants:  Nephrology service   Procedures:  2-D echo - Left ventricle: The cavity size was normal. There was severe   focal basal hypertrophy. Systolic function was severely reduced.   The estimated ejection fraction was in the range of 25% to 30%.   Severe diffuse hypokinesis. There is akinesis of the   basalinferior myocardium. There is akinesis of the inferolateral   myocardium. Features are consistent with a pseudonormal left   ventricular filling pattern, with concomitant abnormal relaxation   and increased filling pressure (grade 2 diastolic dysfunction).   Doppler parameters are consistent with high ventricular filling   pressure. - Aortic valve: Mildly to moderately calcified annulus. Trileaflet;   normal thickness, mildly calcified leaflets. There was moderate   regurgitation. - Mitral valve: Calcified annulus. Moderate diffuse calcification   of the anterior leaflet, with moderate involvement of chords.   There was moderate regurgitation directed eccentrically and   posteriorly. Valve area by pressure half-time: 1.62 cm^2. - Pulmonic valve: There was mild regurgitation. - Pulmonary arteries: PA peak pressure: 43 mm Hg (S).  Antibiotics:  vanc and zosyn on admission (stopped on 7/25)  HPI/Subjective: Afebrile, no nausea, no vomiting and no CP. Intermittent and cough and SOB still present.  Objective: Vitals:   01/31/17 1839 01/31/17 1929  BP: 128/66 126/81  Pulse: 88 87  Resp: 18 19  Temp: (!) 97.4 F (36.3 C) 97.6 F (36.4 C)    Intake/Output Summary (Last 24 hours) at 01/31/17 2232 Last data filed at 01/31/17 1839  Gross per 24 hour  Intake              240 ml  Output             1000 ml  Net             -760 ml   Filed Weights   01/31/17 0027 01/31/17 1445  01/31/17 1839  Weight: 79.2 kg (174 lb 9.7 oz) 78.2 kg (172 lb 6.4 oz) 76.7 kg (169 lb 1.5 oz)    Exam:   General: afebrile, with positive dry cough and complaining of SOB with minimal exertion. Patient was even experiencing trouble speaking in full sentences. No CP, no nausea, no vomiting.  Cardiovascular: S1 and S2, no rubs, no gallops, positive soft murmur  Respiratory: decrease BS bilaterally and positive fine crackles on exam; no wheezing   Abdomen: soft, NT, ND, positive BS  Musculoskeletal: no edema, no cyanosis   Data Reviewed: Basic Metabolic Panel:  Recent Labs Lab 01/30/17 2145  NA 138  K 3.4*  CL 98*  CO2 29  GLUCOSE 96  BUN 34*  CREATININE 10.60*  CALCIUM 9.5   CBC:  Recent Labs Lab 01/30/17 2145  WBC 4.9  NEUTROABS 2.8  HGB 9.9*  HCT 30.6*  MCV 99.4  PLT 164   CBG: No results for input(s): GLUCAP in the last 168 hours.  Recent Results (from the past 240 hour(s))  Culture, blood (Routine X 2) w Reflex to ID Panel     Status: None (Preliminary result)   Collection Time: 01/30/17  9:45 PM  Result Value Ref Range Status   Specimen Description BLOOD RIGHT ANTECUBITAL  Final   Special Requests   Final    BOTTLES DRAWN AEROBIC AND ANAEROBIC Blood Culture adequate volume   Culture NO GROWTH < 12 HOURS  Final   Report Status PENDING  Incomplete  Culture, blood (Routine X 2) w Reflex to ID Panel     Status: None (Preliminary result)   Collection Time: 01/30/17  9:53 PM  Result Value Ref Range Status   Specimen Description BLOOD RIGHT HAND  Final   Special Requests   Final    BOTTLES DRAWN AEROBIC AND ANAEROBIC Blood Culture adequate volume   Culture NO GROWTH < 12 HOURS  Final   Report Status PENDING  Incomplete  Respiratory Panel by PCR     Status: None   Collection Time: 01/31/17  1:51 AM  Result Value Ref Range Status   Adenovirus NOT DETECTED NOT DETECTED Final   Coronavirus 229E NOT DETECTED NOT DETECTED Final   Coronavirus HKU1 NOT  DETECTED NOT DETECTED Final   Coronavirus NL63 NOT DETECTED NOT DETECTED Final   Coronavirus OC43 NOT DETECTED NOT DETECTED Final   Metapneumovirus NOT DETECTED NOT DETECTED Final   Rhinovirus / Enterovirus NOT DETECTED NOT DETECTED Final   Influenza A NOT DETECTED NOT DETECTED Final   Influenza B NOT DETECTED NOT DETECTED Final   Parainfluenza Virus 1 NOT DETECTED NOT DETECTED Final   Parainfluenza Virus 2 NOT DETECTED NOT DETECTED Final   Parainfluenza Virus 3 NOT DETECTED NOT DETECTED Final   Parainfluenza Virus 4 NOT DETECTED NOT DETECTED Final   Respiratory Syncytial Virus NOT DETECTED NOT DETECTED Final   Bordetella pertussis NOT DETECTED NOT DETECTED Final   Chlamydophila pneumoniae NOT DETECTED NOT DETECTED Final   Mycoplasma pneumoniae NOT DETECTED NOT DETECTED Final  MRSA PCR Screening     Status: None   Collection Time: 01/31/17  1:51 AM  Result Value Ref  Range Status   MRSA by PCR NEGATIVE NEGATIVE Final    Comment:        The GeneXpert MRSA Assay (FDA approved for NASAL specimens only), is one component of a comprehensive MRSA colonization surveillance program. It is not intended to diagnose MRSA infection nor to guide or monitor treatment for MRSA infections.      Studies: Dg Chest 2 View  Result Date: 01/30/2017 CLINICAL DATA:  Shortness of breath for 1 month, hypertension, end-stage renal disease on dialysis, GERD, history lymphoma EXAM: CHEST  2 VIEW COMPARISON:  None FINDINGS: Enlargement of cardiac silhouette with slight vascular congestion. Calcified tortuous thoracic aorta. Mediastinal contours normal. BILATERAL basilar pleural effusions and atelectasis. Large ovoid opacity in the RIGHT mid lung suspect pseudotumor/fluid loculated in the minor fissure. Minimal peribronchial thickening and perihilar saturation on markings, cannot exclude minimal edema. No pneumothorax. Stent identified in the RIGHT cervical region. No acute osseous findings. IMPRESSION:  Enlargement of cardiac silhouette with pulmonary vascular congestion. RIGHT pleural effusion with suspect loculated fluid in minor fissure/pseudotumor. Bibasilar atelectasis and small LEFT pleural effusion with question minimal perihilar edema. Aortic Atherosclerosis (ICD10-I70.0) and Emphysema (ICD10-J43.9). Electronically Signed   By: Lavonia Dana M.D.   On: 01/30/2017 15:34   Ct Chest W Contrast  Result Date: 01/31/2017 CLINICAL DATA:  Productive cough and shortness of breath. EXAM: CT CHEST WITH CONTRAST TECHNIQUE: Multidetector CT imaging of the chest was performed during intravenous contrast administration. CONTRAST:  38mL ISOVUE-300 IOPAMIDOL (ISOVUE-300) INJECTION 61% COMPARISON:  Radiograph earlier this day. FINDINGS: Cardiovascular: Advanced aortic atherosclerosis and tortuosity. Atherosclerosis of major branch vessels. Stent in the right proximal common carotid artery is patent. No dissection. No aneurysm. Post CABG with dense calcifications of the native coronary arteries. Mild cardiomegaly. No pericardial effusion. Mediastinum/Nodes: Shotty mediastinal and right hilar nodes. For example, lower paratracheal node measures 11 mm short axis. Subcarinal node measures 12 mm. Right hilar node measures 12 mm. The esophagus is decompressed. Possible low-density in the right lobe of the thyroid gland versus artifact from adjacent carotid stent. Lungs/Pleura: There is a loculated right pleural effusion, fluid volume is moderate in degree. Loculated fluid in both the minor and major fissure. Small amount of dependent pleural fluid as well as pleural fluid in the upper right hemithorax. All fluid measures simple fluid density. There is a trace dependent left pleural effusion. No fluid in the left fissure. Moderate septal thickening consistent pulmonary edema. Scattered compressive atelectasis adjacent to pleural effusions. There is bronchial thickening, greatest in the lower lobes. No consolidation to suggest  pneumonia. Upper Abdomen: Dense atherosclerosis of upper abdominal vasculature. Polycystic kidney disease partially included. Musculoskeletal: There are no acute or suspicious osseous abnormalities. Degenerative change at C7-T1 is partially included with Modic changes. Post median sternotomy with sternal nonunion. Upper most sternal wire is broken, remaining sternal wires are intact. IMPRESSION: 1. Loculated fluid in the major and minor fissure on the right. Small probable lower lobe bronchial thickening. Scattered atelectasis adjacent pleural effusions. No evidence of pneumonia. Free-flowing pleural effusions bilaterally, right greater than left. Moderate septal thickening consistent with pulmonary edema. In conjunction with cardiomegaly, consistent with fluid overload/CHF. 2. Shotty mediastinal and right hilar nodes are likely reactive secondary CHF. 3. Advanced aortic atherosclerosis. Coronary artery calcifications post CABG. Aortic Atherosclerosis (ICD10-I70.0). Electronically Signed   By: Jeb Levering M.D.   On: 01/31/2017 00:30    Scheduled Meds: . aspirin EC  81 mg Oral Daily  . carvedilol  6.25 mg Oral BID  .  cinacalcet  60 mg Oral Q supper  . docusate sodium  100 mg Oral BID  . doxercalciferol  1 mcg Intravenous Q M,W,F-HD  . famotidine  20 mg Oral Daily  . feeding supplement (NEPRO CARB STEADY)  237 mL Oral BID BM  . heparin  5,000 Units Subcutaneous Q8H  . isosorbide mononitrate  60 mg Oral Daily  . magnesium oxide  400 mg Oral TID WC  . multivitamin   Oral Q supper  . pravastatin  20 mg Oral QHS  . sevelamer carbonate  2.4 g Oral TID WC   Continuous Infusions:  Active Problems:   ESRD on dialysis ([facility])   HTN (hypertension)   Lymphoma ([facility])   CAD (coronary artery disease)   HLD (hyperlipidemia)   GERD (gastroesophageal reflux disease)   Hypokalemia   HCAP (healthcare-associated pneumonia)   Malnutrition of moderate degree    Time spent: 30 minutes    Barton Dubois  [facility] Pager [phone]. If 7PM-7AM, please contact night-coverage at [email or web address], password [facility] 01/31/2017, 10:32 PM  LOS: 0 days

## 2017-01-31 NOTE — Progress Notes (Signed)
Initial Nutrition Assessment  DOCUMENTATION CODES:   Non-severe (moderate) malnutrition in context of chronic illness  INTERVENTION:   -Continue Nepro Shake po BID, each supplement provides 425 kcal and 19 grams protein  NUTRITION DIAGNOSIS:   Malnutrition (Mild) related to chronic illness (ESRD on HD) as evidenced by energy intake < 75% for > or equal to 1 month, mild depletion of muscle mass, mild depletion of body fat.  GOAL:   Patient will meet greater than or equal to 90% of their needs  MONITOR:   PO intake, Supplement acceptance, Diet advancement, Labs, Weight trends, Skin, I & O's  REASON FOR ASSESSMENT:   Malnutrition Screening Tool    ASSESSMENT:   Jeremy Kirby is a 66 y.o. male with medical history significant of hypertension, hyperlipidemia, GERD, small bowel obstruction, CAD, s/p of CABG, AAA, GI bleeding, lymphoma in remission, who presents with shortness of breath and productive cough.  Pt admitted with possible HCAP; pt with RLL infiltrates and rt pleural effusion.   S/p BSE this AM; recommend continue regular consistency diet with thin liquids. Per SLP assessment, complaints of pharyngeal globulus sensation and excess mucous likely related to GERD.   Spoke with pt and wife at bedside. Both confirm a decreased appetite over the past 2-3 weeks, due to lack of desire to eat. Pt reports he typically consumes 2 meals per day (Breakfast: 1-2 boiled eggs, Dinner: pepper steak with salad or mashed potatoes). Per pt wife, he will occasionally snack on a hamburger between meals. Pt shares that he tries to focus on protein intake, but feels "it's still not enough".   Pt endorses weight loss, but unsure of amount or time frame in which wt loss occurred. Per pt wife, pt has experienced a slow, gradual wt loss over the past several years. Per [facility] Everywhere records, pt has experienced a 5.4% wt loss over the past 6 months, which is not significant for time frame. Of note, pt  shares his dry weight decreased from 80.5 kg to 80 kg this past week.   Nutrition-Focused physical exam completed. Findings are mild fat depletion, mild muscle depletion, and no edema. Pt reports decreased activity level due to weakness and looser fitting clothing. Pt wife reports she has noted muscle loss related to decreased intake.  Discussed importance of good nutritional intake to promote healing and prevent further weight loss. Pt has tried Nepro supplements in the past and is amenable to continue them during hospitalization.   Labs reviewed: K: 3.4  Diet Order:  Diet renal with fluid restriction Fluid restriction: 1200 mL Fluid; Room service appropriate? Yes; Fluid consistency: Thin  Skin:  Reviewed, no issues  Last BM:  01/30/17  Height:   Ht Readings from Last 1 Encounters:  01/31/17 5\' 11"  (1.803 m)    Weight:   Wt Readings from Last 1 Encounters:  01/31/17 174 lb 9.7 oz (79.2 kg)    Ideal Body Weight:  78.2 kg  BMI:  Body mass index is 24.35 kg/m.  Estimated Nutritional Needs:   Kcal:  1900-2100  Protein:  100-115 grams  Fluid:  1,000 ml + UOP  EDUCATION NEEDS:   Education needs addressed  Jeremy Kirby A. Jimmye Norman, RD, LDN, CDE Pager: [phone] After hours Pager: [phone]

## 2017-02-01 DIAGNOSIS — R0602 Shortness of breath: Secondary | ICD-10-CM

## 2017-02-01 LAB — BASIC METABOLIC PANEL
ANION GAP: 11 (ref 5–15)
BUN: 18 mg/dL (ref 6–20)
CALCIUM: 8.9 mg/dL (ref 8.9–10.3)
CO2: 27 mmol/L (ref 22–32)
Chloride: 98 mmol/L — ABNORMAL LOW (ref 101–111)
Creatinine, Ser: 7.25 mg/dL — ABNORMAL HIGH (ref 0.61–1.24)
GFR, EST AFRICAN AMERICAN: 8 mL/min — AB (ref >60)
GFR, EST NON AFRICAN AMERICAN: 7 mL/min — AB (ref >60)
Glucose, Bld: 89 mg/dL (ref 65–99)
Potassium: 3.4 mmol/L — ABNORMAL LOW (ref 3.5–5.1)
SODIUM: 136 mmol/L (ref 135–145)

## 2017-02-01 NOTE — Consult Note (Signed)
Primary Physician: Primary Cardiologist:Dr Carma Lair) Asked to see by Carles Collet for heart failure   HPI: Pt is a 66 yo with ESRD (on dialysis), CAD (s/p CABG), HTN, AAA, who was admitted on 7/24 with SOB and cough   SOB developing x 1 mnth   He was treated with ABX as an outpt with some improvement  Yellow sputum  Pleuritic CP   In ED sats 92 to 96%    Admitted  Placed on IV ABX   Renal has seen pt as well  Questioned if infit was volume overload ABX stopped yesterday   2D echo done  LVEF 25 to 30%  Severe diffuse hypokinesis; basal inerir akinesis.  Increased filling pressures  Mod AI.  Mod MR    Past cardiac history :  Pt underwent CABG in 2010 at [facility]   In 2014 he underwent L heart catheterization for unstable angina.  Told he had a problem with the blood flow to back of heart  Nothing could be done  Turned down by surgery he said for repeat.  Treated medically Had stable angina for a long time  Past couple months has had increased SOB   Seen by cardiologist in [facility] Rafael Hernandez)  Echo done about 1 wk ago .  Placed on Ranexa recently  Stopped because he thought it made him nauseated   No change in breathing   The patient notes PND  Has had some nausea, decreased appetite.  Wt has been up/down Says he feels a little better after dialysis    Denies signif CP   Past Medical History:  Diagnosis Date  . Abdominal aortic aneurysm (AAA) ([place])   . CAD (coronary artery disease) 05/04/2016  . ESRD on dialysis ([place]) 05/04/2016  . GERD (gastroesophageal reflux disease)   . HTN (hypertension) 05/04/2016  . Lymphoma ([place]) 05/04/2016  . SBO (small bowel obstruction) ([place]) 04/2016  . Unstable angina ([facility])     Medications Prior to Admission  Medication Sig Dispense Refill  . acetaminophen (TYLENOL) 500 MG tablet Take 500 mg by mouth daily as needed (pain).    Marland Kitchen amLODipine (NORVASC) 5 MG tablet Take 5 mg by mouth at bedtime. Hold for systolic BP <163    . aspirin  EC 81 MG tablet Take 81 mg by mouth daily.    . B Complex-C-Zn-Folic Acid (DIALYVITE 845 WITH ZINC) 0.8 MG TABS Take 1 tablet by mouth daily with supper.    . carvedilol (COREG) 3.125 MG tablet Take 3.125 mg by mouth 2 (two) times daily with a meal.    . cinacalcet (SENSIPAR) 60 MG tablet Take 60 mg by mouth daily with supper.    . docusate sodium (COLACE) 100 MG capsule Take 100 mg by mouth 2 (two) times daily.    . fluticasone (FLONASE) 50 MCG/ACT nasal spray Place 2 sprays into both nostrils daily as needed (congestion).     . hydrOXYzine (ATARAX/VISTARIL) 25 MG tablet Take 25 mg by mouth every 8 (eight) hours as needed for itching.   1  . isosorbide mononitrate (IMDUR) 60 MG 24 hr tablet Take 60 mg by mouth daily.  6  . magnesium oxide (MAG-OX) 400 MG tablet Take 400 mg by mouth 3 (three) times daily with meals.    . nitroGLYCERIN (NITROSTAT) 0.4 MG SL tablet Place 0.4 mg under the tongue every 5 (five) minutes as needed for chest pain.   1  . polyethylene glycol (MIRALAX / GLYCOLAX) packet Take 17 g  by mouth daily as needed (constipation). Mix in 8 oz of liquid and drink    . pravastatin (PRAVACHOL) 40 MG tablet Take 40 mg by mouth at bedtime.  5  . ranitidine (ZANTAC) 150 MG tablet Take 150 mg by mouth See admin instructions. Take 1 tablet (150 mg) by mouth daily before breakfast, may also take a 2nd tablet later in the day as needed for acid reflux    . sevelamer carbonate (RENVELA) 2.4 g PACK Take 2.4 g by mouth 3 (three) times daily with meals. Mix 1 packet in 2 oz of water and drink       . aspirin EC  81 mg Oral Daily  . carvedilol  6.25 mg Oral BID  . cinacalcet  60 mg Oral Q supper  . docusate sodium  100 mg Oral BID  . doxercalciferol  1 mcg Intravenous Q M,W,F-HD  . famotidine  20 mg Oral Daily  . feeding supplement (NEPRO CARB STEADY)  237 mL Oral BID BM  . heparin  5,000 Units Subcutaneous Q8H  . isosorbide mononitrate  60 mg Oral Daily  . magnesium oxide  400 mg Oral TID  WC  . multivitamin   Oral Q supper  . pravastatin  20 mg Oral QHS  . sevelamer carbonate  2.4 g Oral TID WC    Infusions:   No Known Allergies  Social History   Social History  . Marital status: Married    Spouse name: N/A  . Number of children: N/A  . Years of education: N/A   Occupational History  . Not on file.   Social History Main Topics  . Smoking status: Former Research scientist (life sciences)  . Smokeless tobacco: Never Used     Comment: QUIT SMOKING IN 2007  . Alcohol use No  . Drug use: No  . Sexual activity: Not on file   Other Topics Concern  . Not on file   Social History Narrative  . No narrative on file    Family History  Problem Relation Age of Onset  . Heart attack Neg Hx     REVIEW OF SYSTEMS:  All systems reviewed  Negative to the above problem except as noted above.    PHYSICAL EXAM: Vitals:   02/01/17 1000 02/01/17 1703  BP: 135/71 (!) 119/58  Pulse: 86 86  Resp: 18 20  Temp: (!) 97 F (36.1 C) 98 F (36.7 C)     Intake/Output Summary (Last 24 hours) at 02/01/17 1745 Last data filed at 02/01/17 1520  Gross per 24 hour  Intake              600 ml  Output             1000 ml  Net             -400 ml    General:  Well appearing. No respiratory difficulty HEENT: normal Neck: supple. JVP is increased  Carotids 2+ bilat; no bruits. No lymphadenopathy or thryomegaly appreciated. Cor: PMI nondisplaced. Regular rate & rhythm. No rubs, gallops  II/VI systolic murmur apex   Lungs: Rales at bases   Abdomen: soft, nontender, nondistended. No hepatosplenomegaly. No bruits or masses. Good bowel sounds.  Ventral hernia   Extremities: no cyanosis, clubbing, rash, edema  1+ DP   Neuro: alert & oriented x 3, cranial nerves grossly intact. moves all 4 extremities w/o difficulty. Affect pleasant.  ECG:  7/24  SR  84 bpm  Nonspecific IVCD  ST depression consider  ischemia (unchanged from 2017)    Results for orders placed or performed during the hospital encounter of  01/30/17 (from the past 24 hour(s))  Basic metabolic panel     Status: Abnormal   Collection Time: 02/01/17  4:12 AM  Result Value Ref Range   Sodium 136 135 - 145 mmol/L   Potassium 3.4 (L) 3.5 - 5.1 mmol/L   Chloride 98 (L) 101 - 111 mmol/L   CO2 27 22 - 32 mmol/L   Glucose, Bld 89 65 - 99 mg/dL   BUN 18 6 - 20 mg/dL   Creatinine, Ser 7.25 (H) 0.61 - 1.24 mg/dL   Calcium 8.9 8.9 - 10.3 mg/dL   GFR calc non Af Amer 7 (L) >60 mL/min   GFR calc Af Amer 8 (L) >60 mL/min   Anion gap 11 5 - 15   Ct Chest W Contrast  Result Date: 01/31/2017 CLINICAL DATA:  Productive cough and shortness of breath. EXAM: CT CHEST WITH CONTRAST TECHNIQUE: Multidetector CT imaging of the chest was performed during intravenous contrast administration. CONTRAST:  55mL ISOVUE-300 IOPAMIDOL (ISOVUE-300) INJECTION 61% COMPARISON:  Radiograph earlier this day. FINDINGS: Cardiovascular: Advanced aortic atherosclerosis and tortuosity. Atherosclerosis of major branch vessels. Stent in the right proximal common carotid artery is patent. No dissection. No aneurysm. Post CABG with dense calcifications of the native coronary arteries. Mild cardiomegaly. No pericardial effusion. Mediastinum/Nodes: Shotty mediastinal and right hilar nodes. For example, lower paratracheal node measures 11 mm short axis. Subcarinal node measures 12 mm. Right hilar node measures 12 mm. The esophagus is decompressed. Possible low-density in the right lobe of the thyroid gland versus artifact from adjacent carotid stent. Lungs/Pleura: There is a loculated right pleural effusion, fluid volume is moderate in degree. Loculated fluid in both the minor and major fissure. Small amount of dependent pleural fluid as well as pleural fluid in the upper right hemithorax. All fluid measures simple fluid density. There is a trace dependent left pleural effusion. No fluid in the left fissure. Moderate septal thickening consistent pulmonary edema. Scattered compressive  atelectasis adjacent to pleural effusions. There is bronchial thickening, greatest in the lower lobes. No consolidation to suggest pneumonia. Upper Abdomen: Dense atherosclerosis of upper abdominal vasculature. Polycystic kidney disease partially included. Musculoskeletal: There are no acute or suspicious osseous abnormalities. Degenerative change at C7-T1 is partially included with Modic changes. Post median sternotomy with sternal nonunion. Upper most sternal wire is broken, remaining sternal wires are intact. IMPRESSION: 1. Loculated fluid in the major and minor fissure on the right. Small probable lower lobe bronchial thickening. Scattered atelectasis adjacent pleural effusions. No evidence of pneumonia. Free-flowing pleural effusions bilaterally, right greater than left. Moderate septal thickening consistent with pulmonary edema. In conjunction with cardiomegaly, consistent with fluid overload/CHF. 2. Shotty mediastinal and right hilar nodes are likely reactive secondary CHF. 3. Advanced aortic atherosclerosis. Coronary artery calcifications post CABG. Aortic Atherosclerosis (ICD10-I70.0). Electronically Signed   By: Jeb Levering M.D.   On: 01/31/2017 00:30     ASSESSMENT: Patient is a 66 yo with known CAD  Admitted with SOB   Initially treated for URI but work up goes against infection, more toward CHF   Echo done showed LVEF 25to 30% with mod MR  Note that this is relatively unchanged from echo done in Fall, 2017.  QUestoin then is what has changed clinically   Why is he SOB  On exam, evid of increased volume   Question if pt is not at dry wt.  He  says that his appetite has not been good and eating less  SOme of wt may be fluid.   He does have a history of signif CAD  Turned down for intervention in 2014  He is not having CP but SOB could be anginal equivalent though I am not convinced   I will write to get records on last cath from [place]  Depending on anatomy could consider a R heart and  possible L heart cath.  Keep on current medical regimen  2   CAD  As above   WIll try to get records  3  HL  Continue statin  4  ESRD  Has dialysis MWF.  Dorris Carnes

## 2017-02-01 NOTE — Progress Notes (Signed)
RT NOTE:  Pt has home machine at bedside and family manages. RT available if needed.

## 2017-02-01 NOTE — Progress Notes (Signed)
[facility] ROUNDING NOTE   Subjective:   HPI:  66 y.o.malewith medical history significant of hypertension, hyperlipidemia, GERD, small bowel obstruction, CAD, s/p of CABG, AAA, GI bleeding, lymphomain remission, who presents with shortness of breath and productive and a right infiltrate that is thought to be pneumonia. He is being treated with vancomycin and zosyn. He receives dialysis MWF, his blood pressure looks fairly well controlled and dialyzes with a fistula.  He had no increase in white count and is not febrile. I wonder if the infiltrate is related to volume overload and needs challenging of his dry weight. He denies any cardiac symptoms and has had a CABG in the past   Objective:  Vital signs in last 24 hours:  Temp:  [97 F (36.1 C)-97.9 F (36.6 C)] 97 F (36.1 C) (07/26 1000) Pulse Rate:  [82-92] 86 (07/26 1000) Resp:  [18-19] 18 (07/26 1000) BP: (126-153)/(66-92) 135/71 (07/26 1000) SpO2:  [97 %-100 %] 100 % (07/26 1000) Weight:  [169 lb 1.5 oz (76.7 kg)-172 lb 6.4 oz (78.2 kg)] 169 lb 1.5 oz (76.7 kg) (07/25 1839)  Weight change: -12 lb 9.6 oz (-5.715 kg) Filed Weights   01/31/17 0027 01/31/17 1445 01/31/17 1839  Weight: 174 lb 9.7 oz (79.2 kg) 172 lb 6.4 oz (78.2 kg) 169 lb 1.5 oz (76.7 kg)    Intake/Output: I/O last 3 completed shifts: In: 290 [P.O.:240; IV Piggyback:50] Out: 1000 [Other:1000]   Intake/Output this shift:  Total I/O In: 240 [P.O.:240] Out: 0   CVS- RRR RS- CTA diminished in bases ABD- BS present soft non-distended EXT- no edema   Basic Metabolic Panel:  Recent Labs Lab 01/30/17 2145 02/01/17 0412  NA 138 136  K 3.4* 3.4*  CL 98* 98*  CO2 29 27  GLUCOSE 96 89  BUN 34* 18  CREATININE 10.60* 7.25*  CALCIUM 9.5 8.9    Liver Function Tests: No results for input(s): AST, ALT, ALKPHOS, BILITOT, PROT, ALBUMIN in the last 168 hours. No results for input(s): LIPASE, AMYLASE in the last 168 hours. No results  for input(s): AMMONIA in the last 168 hours.  CBC:  Recent Labs Lab 01/30/17 2145  WBC 4.9  NEUTROABS 2.8  HGB 9.9*  HCT 30.6*  MCV 99.4  PLT 164    Cardiac Enzymes: No results for input(s): CKTOTAL, CKMB, CKMBINDEX, TROPONINI in the last 168 hours.  BNP: Invalid input(s): POCBNP  CBG: No results for input(s): GLUCAP in the last 168 hours.  Microbiology: Results for orders placed or performed during the hospital encounter of 01/30/17  Culture, blood (Routine X 2) w Reflex to ID Panel     Status: None (Preliminary result)   Collection Time: 01/30/17  9:45 PM  Result Value Ref Range Status   Specimen Description BLOOD RIGHT ANTECUBITAL  Final   Special Requests   Final    BOTTLES DRAWN AEROBIC AND ANAEROBIC Blood Culture adequate volume   Culture NO GROWTH < 12 HOURS  Final   Report Status PENDING  Incomplete  Culture, blood (Routine X 2) w Reflex to ID Panel     Status: None (Preliminary result)   Collection Time: 01/30/17  9:53 PM  Result Value Ref Range Status   Specimen Description BLOOD RIGHT HAND  Final   Special Requests   Final    BOTTLES DRAWN AEROBIC AND ANAEROBIC Blood Culture adequate volume   Culture NO GROWTH < 12 HOURS  Final   Report Status PENDING  Incomplete  Respiratory Panel by  PCR     Status: None   Collection Time: 01/31/17  1:51 AM  Result Value Ref Range Status   Adenovirus NOT DETECTED NOT DETECTED Final   Coronavirus 229E NOT DETECTED NOT DETECTED Final   Coronavirus HKU1 NOT DETECTED NOT DETECTED Final   Coronavirus NL63 NOT DETECTED NOT DETECTED Final   Coronavirus OC43 NOT DETECTED NOT DETECTED Final   Metapneumovirus NOT DETECTED NOT DETECTED Final   Rhinovirus / Enterovirus NOT DETECTED NOT DETECTED Final   Influenza A NOT DETECTED NOT DETECTED Final   Influenza B NOT DETECTED NOT DETECTED Final   Parainfluenza Virus 1 NOT DETECTED NOT DETECTED Final   Parainfluenza Virus 2 NOT DETECTED NOT DETECTED Final   Parainfluenza Virus 3 NOT  DETECTED NOT DETECTED Final   Parainfluenza Virus 4 NOT DETECTED NOT DETECTED Final   Respiratory Syncytial Virus NOT DETECTED NOT DETECTED Final   Bordetella pertussis NOT DETECTED NOT DETECTED Final   Chlamydophila pneumoniae NOT DETECTED NOT DETECTED Final   Mycoplasma pneumoniae NOT DETECTED NOT DETECTED Final  MRSA PCR Screening     Status: None   Collection Time: 01/31/17  1:51 AM  Result Value Ref Range Status   MRSA by PCR NEGATIVE NEGATIVE Final    Comment:        The GeneXpert MRSA Assay (FDA approved for NASAL specimens only), is one component of a comprehensive MRSA colonization surveillance program. It is not intended to diagnose MRSA infection nor to guide or monitor treatment for MRSA infections.     Coagulation Studies: No results for input(s): LABPROT, INR in the last 72 hours.  Urinalysis: No results for input(s): COLORURINE, LABSPEC, PHURINE, GLUCOSEU, HGBUR, BILIRUBINUR, KETONESUR, PROTEINUR, UROBILINOGEN, NITRITE, LEUKOCYTESUR in the last 72 hours.  Invalid input(s): APPERANCEUR    Imaging: Dg Chest 2 View  Result Date: 01/30/2017 CLINICAL DATA:  Shortness of breath for 1 month, hypertension, end-stage renal disease on dialysis, GERD, history lymphoma EXAM: CHEST  2 VIEW COMPARISON:  None FINDINGS: Enlargement of cardiac silhouette with slight vascular congestion. Calcified tortuous thoracic aorta. Mediastinal contours normal. BILATERAL basilar pleural effusions and atelectasis. Large ovoid opacity in the RIGHT mid lung suspect pseudotumor/fluid loculated in the minor fissure. Minimal peribronchial thickening and perihilar saturation on markings, cannot exclude minimal edema. No pneumothorax. Stent identified in the RIGHT cervical region. No acute osseous findings. IMPRESSION: Enlargement of cardiac silhouette with pulmonary vascular congestion. RIGHT pleural effusion with suspect loculated fluid in minor fissure/pseudotumor. Bibasilar atelectasis and small  LEFT pleural effusion with question minimal perihilar edema. Aortic Atherosclerosis (ICD10-I70.0) and Emphysema (ICD10-J43.9). Electronically Signed   By: Lavonia Dana M.D.   On: 01/30/2017 15:34   Ct Chest W Contrast  Result Date: 01/31/2017 CLINICAL DATA:  Productive cough and shortness of breath. EXAM: CT CHEST WITH CONTRAST TECHNIQUE: Multidetector CT imaging of the chest was performed during intravenous contrast administration. CONTRAST:  62mL ISOVUE-300 IOPAMIDOL (ISOVUE-300) INJECTION 61% COMPARISON:  Radiograph earlier this day. FINDINGS: Cardiovascular: Advanced aortic atherosclerosis and tortuosity. Atherosclerosis of major branch vessels. Stent in the right proximal common carotid artery is patent. No dissection. No aneurysm. Post CABG with dense calcifications of the native coronary arteries. Mild cardiomegaly. No pericardial effusion. Mediastinum/Nodes: Shotty mediastinal and right hilar nodes. For example, lower paratracheal node measures 11 mm short axis. Subcarinal node measures 12 mm. Right hilar node measures 12 mm. The esophagus is decompressed. Possible low-density in the right lobe of the thyroid gland versus artifact from adjacent carotid stent. Lungs/Pleura: There is a loculated right  pleural effusion, fluid volume is moderate in degree. Loculated fluid in both the minor and major fissure. Small amount of dependent pleural fluid as well as pleural fluid in the upper right hemithorax. All fluid measures simple fluid density. There is a trace dependent left pleural effusion. No fluid in the left fissure. Moderate septal thickening consistent pulmonary edema. Scattered compressive atelectasis adjacent to pleural effusions. There is bronchial thickening, greatest in the lower lobes. No consolidation to suggest pneumonia. Upper Abdomen: Dense atherosclerosis of upper abdominal vasculature. Polycystic kidney disease partially included. Musculoskeletal: There are no acute or suspicious osseous  abnormalities. Degenerative change at C7-T1 is partially included with Modic changes. Post median sternotomy with sternal nonunion. Upper most sternal wire is broken, remaining sternal wires are intact. IMPRESSION: 1. Loculated fluid in the major and minor fissure on the right. Small probable lower lobe bronchial thickening. Scattered atelectasis adjacent pleural effusions. No evidence of pneumonia. Free-flowing pleural effusions bilaterally, right greater than left. Moderate septal thickening consistent with pulmonary edema. In conjunction with cardiomegaly, consistent with fluid overload/CHF. 2. Shotty mediastinal and right hilar nodes are likely reactive secondary CHF. 3. Advanced aortic atherosclerosis. Coronary artery calcifications post CABG. Aortic Atherosclerosis (ICD10-I70.0). Electronically Signed   By: Jeb Levering M.D.   On: 01/31/2017 00:30     Medications:    . aspirin EC  81 mg Oral Daily  . carvedilol  6.25 mg Oral BID  . cinacalcet  60 mg Oral Q supper  . docusate sodium  100 mg Oral BID  . doxercalciferol  1 mcg Intravenous Q M,W,F-HD  . famotidine  20 mg Oral Daily  . feeding supplement (NEPRO CARB STEADY)  237 mL Oral BID BM  . heparin  5,000 Units Subcutaneous Q8H  . isosorbide mononitrate  60 mg Oral Daily  . magnesium oxide  400 mg Oral TID WC  . multivitamin   Oral Q supper  . pravastatin  20 mg Oral QHS  . sevelamer carbonate  2.4 g Oral TID WC   acetaminophen, albuterol, dextromethorphan-guaiFENesin, fluticasone, hydrALAZINE, hydrOXYzine, nitroGLYCERIN, polyethylene glycol, zolpidem  Assessment/ Plan:   ESRD- MWF dialysis  - [facility]  ANEMIA- Hb 9.9  Will evaluate ESA  MBD- follow calcium and phosphorus and adjust vitamin D  HTN/VOL- continue to challenge EDW   ACCESS- AVF with thrill appears to be functioning well     LOS: 1 Thaila Bottoms W @TODAY @11 :15 AM

## 2017-02-01 NOTE — Progress Notes (Signed)
[facility] PROGRESS NOTE  Quantarius Genrich VPX:[id number] DOB: 30-Nov-1950 DOA: 01/30/2017 PCP: Patient, No Pcp Per  Interim summary and HPI 66 y.o. male with medical history significant of hypertension, hyperlipidemia, GERD, small bowel obstruction, CAD, s/p of CABG, AAA, GI bleeding, lymphoma in remission, who presents with shortness of breath and dry cough. No fever, no leukocytosis. Symptoms present for over a month and slowly progressing.  Assessment/Plan: 1-acute resp failure with hypoxia: just recently started on oxygen; due to acute on chronic systolic HF -patient symptoms present for over a month now and worsening. Reporting increase SOB with minimal activity and positive orthopnea/PND. -has remained without fever leukocytosis -no productive and CT scan essentially ruling out PNA -continue monitoring off abx's -continue low sodium diet, daily weight and strict I;s and O;s -HD for volume control -cardiology service consulted for assistance evaluating component of ischemia/further adjustment on his heart meds with acute systolic HF. -norvasc discontinued; continue coreg and isosorbide -if BP remains stable to slightly up will benefit of hydralazine; but now that cardiology has been consulted will follow rec's  2-essential HTN -controlled with coreg and isosorbide -will monitor -HD also helping with BP control  3- ESRD -continue HD -renal on board -looking to have further decrease on dry weight and challenge to pulled fluids during tomorrow HD -will follow rec's  4-hx of lymphoma -stable -condition has been incative for over 20 years now  5-HLD -continue pravachol  6-GERD -continue PPI  7-hypokalemia -K 3.4 -to be corrected with HD -will monitor trend -patient monitor on telemetry   8-moderate protein calorie malnutrition -will follow nutritional service rec's -continue feeding supplements -patient report poor appetite   9-OSA -contiue CPAP QHS  Code  Status: Full Family Communication: wife at bedside  Disposition Plan: abx's discontinued, no fever, no productive cough, no elevation of WBC'w. Patient continue to be SOB and reported orthopnea. Unable to have a lot of fluids pulled with HD. Will continue coreg and isosorbide; after discussing with nephrology will involved cardiology service; ?? Angina equivalent form SOB and need of heart cath/other evaluation.   Consultants:  Nephrology service   Cardiology   Procedures:  2-D echo - Left ventricle: The cavity size was normal. There was severe   focal basal hypertrophy. Systolic function was severely reduced.   The estimated ejection fraction was in the range of 25% to 30%.   Severe diffuse hypokinesis. There is akinesis of the   basalinferior myocardium. There is akinesis of the inferolateral   myocardium. Features are consistent with a pseudonormal left   ventricular filling pattern, with concomitant abnormal relaxation   and increased filling pressure (grade 2 diastolic dysfunction).   Doppler parameters are consistent with high ventricular filling   pressure. - Aortic valve: Mildly to moderately calcified annulus. Trileaflet;   normal thickness, mildly calcified leaflets. There was moderate   regurgitation. - Mitral valve: Calcified annulus. Moderate diffuse calcification   of the anterior leaflet, with moderate involvement of chords.   There was moderate regurgitation directed eccentrically and   posteriorly. Valve area by pressure half-time: 1.62 cm^2. - Pulmonic valve: There was mild regurgitation. - Pulmonary arteries: PA peak pressure: 43 mm Hg (S).  Antibiotics:  vanc and zosyn on admission (stopped on 7/25)  HPI/Subjective: Afebrile, no CP, no nausea, no vomiting, no abd pain. Patient with orthopnea and still SOB/winded with minimal exertion..  Objective: Vitals:   02/01/17 1000 02/01/17 1703  BP: 135/71 (!) 119/58  Pulse: 86 86  Resp: 18 20  Temp: (!)  97 F  (36.1 C) 98 F (36.7 C)    Intake/Output Summary (Last 24 hours) at 02/01/17 2145 Last data filed at 02/01/17 1800  Gross per 24 hour  Intake              840 ml  Output                0 ml  Net              840 ml   Filed Weights   01/31/17 0027 01/31/17 1445 01/31/17 1839  Weight: 79.2 kg (174 lb 9.7 oz) 78.2 kg (172 lb 6.4 oz) 76.7 kg (169 lb 1.5 oz)    Exam:   General: patient afebrile, denying CP and abd pain; no nausea or vomiting. Still with poor appetite. Continue to be SOB and winded with minimal exertion and endorses some orthopnea overnight.  Cardiovascular: positive soft murmur on exam, no rubs, no gallops, no JVD appreciated.  Respiratory: no frank crackles appreciated, no wheezing, decrease BS at bases.  Abdomen: soft, no tenderness, no distension, positive BS  Musculoskeletal: no edema, no cyanosis   Data Reviewed: Basic Metabolic Panel:  Recent Labs Lab 01/30/17 2145 02/01/17 0412  NA 138 136  K 3.4* 3.4*  CL 98* 98*  CO2 29 27  GLUCOSE 96 89  BUN 34* 18  CREATININE 10.60* 7.25*  CALCIUM 9.5 8.9   CBC:  Recent Labs Lab 01/30/17 2145  WBC 4.9  NEUTROABS 2.8  HGB 9.9*  HCT 30.6*  MCV 99.4  PLT 164   CBG: No results for input(s): GLUCAP in the last 168 hours.  Recent Results (from the past 240 hour(s))  Culture, blood (Routine X 2) w Reflex to ID Panel     Status: None (Preliminary result)   Collection Time: 01/30/17  9:45 PM  Result Value Ref Range Status   Specimen Description BLOOD RIGHT ANTECUBITAL  Final   Special Requests   Final    BOTTLES DRAWN AEROBIC AND ANAEROBIC Blood Culture adequate volume   Culture NO GROWTH 2 DAYS  Final   Report Status PENDING  Incomplete  Culture, blood (Routine X 2) w Reflex to ID Panel     Status: None (Preliminary result)   Collection Time: 01/30/17  9:53 PM  Result Value Ref Range Status   Specimen Description BLOOD RIGHT HAND  Final   Special Requests   Final    BOTTLES DRAWN AEROBIC AND  ANAEROBIC Blood Culture adequate volume   Culture NO GROWTH 2 DAYS  Final   Report Status PENDING  Incomplete  Respiratory Panel by PCR     Status: None   Collection Time: 01/31/17  1:51 AM  Result Value Ref Range Status   Adenovirus NOT DETECTED NOT DETECTED Final   Coronavirus 229E NOT DETECTED NOT DETECTED Final   Coronavirus HKU1 NOT DETECTED NOT DETECTED Final   Coronavirus NL63 NOT DETECTED NOT DETECTED Final   Coronavirus OC43 NOT DETECTED NOT DETECTED Final   Metapneumovirus NOT DETECTED NOT DETECTED Final   Rhinovirus / Enterovirus NOT DETECTED NOT DETECTED Final   Influenza A NOT DETECTED NOT DETECTED Final   Influenza B NOT DETECTED NOT DETECTED Final   Parainfluenza Virus 1 NOT DETECTED NOT DETECTED Final   Parainfluenza Virus 2 NOT DETECTED NOT DETECTED Final   Parainfluenza Virus 3 NOT DETECTED NOT DETECTED Final   Parainfluenza Virus 4 NOT DETECTED NOT DETECTED Final   Respiratory Syncytial Virus NOT DETECTED NOT DETECTED Final  Bordetella pertussis NOT DETECTED NOT DETECTED Final   Chlamydophila pneumoniae NOT DETECTED NOT DETECTED Final   Mycoplasma pneumoniae NOT DETECTED NOT DETECTED Final  MRSA PCR Screening     Status: None   Collection Time: 01/31/17  1:51 AM  Result Value Ref Range Status   MRSA by PCR NEGATIVE NEGATIVE Final    Comment:        The GeneXpert MRSA Assay (FDA approved for NASAL specimens only), is one component of a comprehensive MRSA colonization surveillance program. It is not intended to diagnose MRSA infection nor to guide or monitor treatment for MRSA infections.      Studies: Ct Chest W Contrast  Result Date: 01/31/2017 CLINICAL DATA:  Productive cough and shortness of breath. EXAM: CT CHEST WITH CONTRAST TECHNIQUE: Multidetector CT imaging of the chest was performed during intravenous contrast administration. CONTRAST:  38mL ISOVUE-300 IOPAMIDOL (ISOVUE-300) INJECTION 61% COMPARISON:  Radiograph earlier this day. FINDINGS:  Cardiovascular: Advanced aortic atherosclerosis and tortuosity. Atherosclerosis of major branch vessels. Stent in the right proximal common carotid artery is patent. No dissection. No aneurysm. Post CABG with dense calcifications of the native coronary arteries. Mild cardiomegaly. No pericardial effusion. Mediastinum/Nodes: Shotty mediastinal and right hilar nodes. For example, lower paratracheal node measures 11 mm short axis. Subcarinal node measures 12 mm. Right hilar node measures 12 mm. The esophagus is decompressed. Possible low-density in the right lobe of the thyroid gland versus artifact from adjacent carotid stent. Lungs/Pleura: There is a loculated right pleural effusion, fluid volume is moderate in degree. Loculated fluid in both the minor and major fissure. Small amount of dependent pleural fluid as well as pleural fluid in the upper right hemithorax. All fluid measures simple fluid density. There is a trace dependent left pleural effusion. No fluid in the left fissure. Moderate septal thickening consistent pulmonary edema. Scattered compressive atelectasis adjacent to pleural effusions. There is bronchial thickening, greatest in the lower lobes. No consolidation to suggest pneumonia. Upper Abdomen: Dense atherosclerosis of upper abdominal vasculature. Polycystic kidney disease partially included. Musculoskeletal: There are no acute or suspicious osseous abnormalities. Degenerative change at C7-T1 is partially included with Modic changes. Post median sternotomy with sternal nonunion. Upper most sternal wire is broken, remaining sternal wires are intact. IMPRESSION: 1. Loculated fluid in the major and minor fissure on the right. Small probable lower lobe bronchial thickening. Scattered atelectasis adjacent pleural effusions. No evidence of pneumonia. Free-flowing pleural effusions bilaterally, right greater than left. Moderate septal thickening consistent with pulmonary edema. In conjunction with  cardiomegaly, consistent with fluid overload/CHF. 2. Shotty mediastinal and right hilar nodes are likely reactive secondary CHF. 3. Advanced aortic atherosclerosis. Coronary artery calcifications post CABG. Aortic Atherosclerosis (ICD10-I70.0). Electronically Signed   By: Jeb Levering M.D.   On: 01/31/2017 00:30    Scheduled Meds: . aspirin EC  81 mg Oral Daily  . carvedilol  6.25 mg Oral BID  . cinacalcet  60 mg Oral Q supper  . docusate sodium  100 mg Oral BID  . doxercalciferol  1 mcg Intravenous Q M,W,F-HD  . famotidine  20 mg Oral Daily  . feeding supplement (NEPRO CARB STEADY)  237 mL Oral BID BM  . heparin  5,000 Units Subcutaneous Q8H  . isosorbide mononitrate  60 mg Oral Daily  . magnesium oxide  400 mg Oral TID WC  . multivitamin   Oral Q supper  . pravastatin  20 mg Oral QHS  . sevelamer carbonate  2.4 g Oral TID WC   Continuous  Infusions:  Principal Problem:   HCAP (healthcare-associated pneumonia) Active Problems:   ESRD on dialysis ([place])   HTN (hypertension)   Lymphoma ([place])   CAD (coronary artery disease)   HLD (hyperlipidemia)   GERD (gastroesophageal reflux disease)   Hypokalemia   Malnutrition of moderate degree    Time spent: 30 minutes    Barton Dubois  [facility] Pager [phone]. If 7PM-7AM, please contact night-coverage at [email or web address], password [facility] 02/01/2017, 9:45 PM  LOS: 1 day

## 2017-02-02 DIAGNOSIS — I5043 Acute on chronic combined systolic (congestive) and diastolic (congestive) heart failure: Secondary | ICD-10-CM

## 2017-02-02 LAB — CBC
HCT: 29.5 % — ABNORMAL LOW (ref 39.0–52.0)
Hemoglobin: 9.4 g/dL — ABNORMAL LOW (ref 13.0–17.0)
MCH: 31 pg (ref 26.0–34.0)
MCHC: 31.9 g/dL (ref 30.0–36.0)
MCV: 97.4 fL (ref 78.0–100.0)
PLATELETS: 174 10*3/uL (ref 150–400)
RBC: 3.03 MIL/uL — ABNORMAL LOW (ref 4.22–5.81)
RDW: 15.1 % (ref 11.5–15.5)
WBC: 5.5 10*3/uL (ref 4.0–10.5)

## 2017-02-02 MED ORDER — NYSTATIN 100000 UNIT/ML MT SUSP
5.0000 mL | Freq: Four times a day (QID) | OROMUCOSAL | Status: DC
  Administered 2017-02-02 – 2017-02-03 (×4): 500000 [IU] via ORAL
  Filled 2017-02-02 (×4): qty 5

## 2017-02-02 MED ORDER — LIDOCAINE HCL (PF) 1 % IJ SOLN: 5.0000 mL | INTRAMUSCULAR | Status: DC | PRN

## 2017-02-02 MED ORDER — SODIUM CHLORIDE 0.9 % IV SOLN: 100.0000 mL | INTRAVENOUS | Status: DC | PRN

## 2017-02-02 MED ORDER — DOXERCALCIFEROL 4 MCG/2ML IV SOLN
INTRAVENOUS | Status: AC
  Administered 2017-02-02: 1 ug via INTRAVENOUS
  Filled 2017-02-02: qty 2

## 2017-02-02 MED ORDER — ALTEPLASE 2 MG IJ SOLR: 2.0000 mg | Freq: Once | INTRAMUSCULAR | Status: DC | PRN

## 2017-02-02 MED ORDER — ACETAMINOPHEN 325 MG PO TABS
ORAL_TABLET | ORAL | Status: AC
  Filled 2017-02-02: qty 2

## 2017-02-02 MED ORDER — PENTAFLUOROPROP-TETRAFLUOROETH EX AERO: 1.0000 "application " | INHALATION_SPRAY | CUTANEOUS | Status: DC | PRN

## 2017-02-02 MED ORDER — HEPARIN SODIUM (PORCINE) 1000 UNIT/ML DIALYSIS: 1000.0000 [IU] | INTRAMUSCULAR | Status: DC | PRN

## 2017-02-02 MED ORDER — LIDOCAINE-PRILOCAINE 2.5-2.5 % EX CREA: 1.0000 "application " | TOPICAL_CREAM | CUTANEOUS | Status: DC | PRN

## 2017-02-02 NOTE — Progress Notes (Signed)
PROGRESS NOTE    Jeremy Kirby  CHE:[id number] DOB: 10/17/1950 DOA: 01/30/2017 PCP: Patient, No Pcp Per   Brief Narrative:  66 year old male with history of hypertension, dyslipidemia, GERD, small bowel obstruction, coronary artery disease status post CABG, AAA, GI bleeding, lymphoma in remission presented with shortness of breath and dry cough. No fever, no leukocytosis.  Assessment & Plan:   #Acute respiratory failure with hypoxia: Recently started on oxygen due to hypoxia in the setting of heart failure -Patient's current symptoms are due to fluid overload/acute pulmonary vascular edema in the setting of heart failure -Continue hemodialysis treatment -Currently on 2 L of oxygen -Do not think it is pneumonia, off antibiotics, cultures negative  # Acute on chronic combined systolic and diastolic congestive heart failure: Echocardiogram done on July 25 consistent with EF of 25-30% with severe diffuse hypokinesis and grade 2 diastolic dysfunction. Currently consult appreciated. Trying to get records from patient's cardiologist from outside. Patient may need further investigation including angiogram.  -Extra fluid/ultrafiltration during dialysis per nephrologist. -Low salt diet -Continue aspirin, Coreg, Imdur   #Essential hypertension: Blood pressure acceptable. Continue current cardiac medications.  #ESRD on hemodialysis: Dialysis today. Nephrology consult appreciated. Monitor volume status and blood pressure.  #GERD: Continue Pepcid  #Dyslipidemia: Continue pravastatin  #Hypokalemia: Patient is a hemodialysis patient. Monitor BMP.  #Moderate protein calorie malnutrition: Dietary referral, feeding supplement and supportive care  #OSA: Continue CPAP at night  #Anemia of chronic kidney disease: Monitor CBC. No sign of bleeding.  #Oral candidiasis: Started nystatin swish and swallow.  DVT prophylaxis: Heparin subcutaneous Code Status: Full code Family Communication: Discussed  with the patient's wife Disposition Plan: Likely discharge home versus rehabilitation in 2-3 days    Consultants:   Cardiologist  Nephrologist  Procedures: Echo Antimicrobials:  vanc and zosyn on admission (stopped on 7/25) Subjective: Seen and examined at dialysis unit. Reported white tongue coating and some discomfort. Denied chest pain, shortness of breath, nausea or vomiting today.  Objective: Vitals:   02/02/17 1130 02/02/17 1200 02/02/17 1237 02/02/17 1240  BP: 117/76 130/69 123/68 124/69  Pulse: 86 82 81 83  Resp:   14   Temp:   98 F (36.7 C)   TempSrc:      SpO2:      Weight:   74.1 kg (163 lb 5.8 oz)   Height:        Intake/Output Summary (Last 24 hours) at 02/02/17 1631 Last data filed at 02/02/17 1240  Gross per 24 hour  Intake              240 ml  Output             3000 ml  Net            -2760 ml   Filed Weights   02/01/17 2208 02/02/17 0750 02/02/17 1237  Weight: 76.4 kg (168 lb 6.9 oz) 77.1 kg (169 lb 15.6 oz) 74.1 kg (163 lb 5.8 oz)    Examination:  General exam: Appears calm and comfortable  Mouth: Superficial white coating on mouth Respiratory system: Clear to auscultation. Respiratory effort normal. No wheezing or crackle Cardiovascular system: S1 & S2 heard, RRR.  No pedal edema. Gastrointestinal system: Abdomen is nondistended, soft and nontender. Normal bowel sounds heard. Central nervous system: Alert and oriented. No focal neurological deficits. Skin: No rashes, lesions or ulcers Psychiatry: Judgement and insight appear normal. Mood & affect appropriate.     Data Reviewed: I have personally reviewed following labs and imaging  studies  CBC:  Recent Labs Lab 01/30/17 2145 02/02/17 0630  WBC 4.9 5.5  NEUTROABS 2.8  --   HGB 9.9* 9.4*  HCT 30.6* 29.5*  MCV 99.4 97.4  PLT 164 629   Basic Metabolic Panel:  Recent Labs Lab 01/30/17 2145 02/01/17 0412  NA 138 136  K 3.4* 3.4*  CL 98* 98*  CO2 29 27  GLUCOSE 96 89    BUN 34* 18  CREATININE 10.60* 7.25*  CALCIUM 9.5 8.9   GFR: Estimated Creatinine Clearance: 10.5 mL/min (A) (by C-G formula based on SCr of 7.25 mg/dL (H)). Liver Function Tests: No results for input(s): AST, ALT, ALKPHOS, BILITOT, PROT, ALBUMIN in the last 168 hours. No results for input(s): LIPASE, AMYLASE in the last 168 hours. No results for input(s): AMMONIA in the last 168 hours. Coagulation Profile: No results for input(s): INR, PROTIME in the last 168 hours. Cardiac Enzymes: No results for input(s): CKTOTAL, CKMB, CKMBINDEX, TROPONINI in the last 168 hours. BNP (last 3 results) No results for input(s): PROBNP in the last 8760 hours. HbA1C: No results for input(s): HGBA1C in the last 72 hours. CBG: No results for input(s): GLUCAP in the last 168 hours. Lipid Profile: No results for input(s): CHOL, HDL, LDLCALC, TRIG, CHOLHDL, LDLDIRECT in the last 72 hours. Thyroid Function Tests: No results for input(s): TSH, T4TOTAL, FREET4, T3FREE, THYROIDAB in the last 72 hours. Anemia Panel: No results for input(s): VITAMINB12, FOLATE, FERRITIN, TIBC, IRON, RETICCTPCT in the last 72 hours. Sepsis Labs:  Recent Labs Lab 01/30/17 2205  LATICACIDVEN 1.48    Recent Results (from the past 240 hour(s))  Culture, blood (Routine X 2) w Reflex to ID Panel     Status: None (Preliminary result)   Collection Time: 01/30/17  9:45 PM  Result Value Ref Range Status   Specimen Description BLOOD RIGHT ANTECUBITAL  Final   Special Requests   Final    BOTTLES DRAWN AEROBIC AND ANAEROBIC Blood Culture adequate volume   Culture NO GROWTH 3 DAYS  Final   Report Status PENDING  Incomplete  Culture, blood (Routine X 2) w Reflex to ID Panel     Status: None (Preliminary result)   Collection Time: 01/30/17  9:53 PM  Result Value Ref Range Status   Specimen Description BLOOD RIGHT HAND  Final   Special Requests   Final    BOTTLES DRAWN AEROBIC AND ANAEROBIC Blood Culture adequate volume   Culture  NO GROWTH 3 DAYS  Final   Report Status PENDING  Incomplete  Respiratory Panel by PCR     Status: None   Collection Time: 01/31/17  1:51 AM  Result Value Ref Range Status   Adenovirus NOT DETECTED NOT DETECTED Final   Coronavirus 229E NOT DETECTED NOT DETECTED Final   Coronavirus HKU1 NOT DETECTED NOT DETECTED Final   Coronavirus NL63 NOT DETECTED NOT DETECTED Final   Coronavirus OC43 NOT DETECTED NOT DETECTED Final   Metapneumovirus NOT DETECTED NOT DETECTED Final   Rhinovirus / Enterovirus NOT DETECTED NOT DETECTED Final   Influenza A NOT DETECTED NOT DETECTED Final   Influenza B NOT DETECTED NOT DETECTED Final   Parainfluenza Virus 1 NOT DETECTED NOT DETECTED Final   Parainfluenza Virus 2 NOT DETECTED NOT DETECTED Final   Parainfluenza Virus 3 NOT DETECTED NOT DETECTED Final   Parainfluenza Virus 4 NOT DETECTED NOT DETECTED Final   Respiratory Syncytial Virus NOT DETECTED NOT DETECTED Final   Bordetella pertussis NOT DETECTED NOT DETECTED Final   Chlamydophila  pneumoniae NOT DETECTED NOT DETECTED Final   Mycoplasma pneumoniae NOT DETECTED NOT DETECTED Final  MRSA PCR Screening     Status: None   Collection Time: 01/31/17  1:51 AM  Result Value Ref Range Status   MRSA by PCR NEGATIVE NEGATIVE Final    Comment:        The GeneXpert MRSA Assay (FDA approved for NASAL specimens only), is one component of a comprehensive MRSA colonization surveillance program. It is not intended to diagnose MRSA infection nor to guide or monitor treatment for MRSA infections.          Radiology Studies: No results found.      Scheduled Meds: . aspirin EC  81 mg Oral Daily  . carvedilol  6.25 mg Oral BID  . cinacalcet  60 mg Oral Q supper  . docusate sodium  100 mg Oral BID  . doxercalciferol  1 mcg Intravenous Q M,W,F-HD  . famotidine  20 mg Oral Daily  . feeding supplement (NEPRO CARB STEADY)  237 mL Oral BID BM  . heparin  5,000 Units Subcutaneous Q8H  . isosorbide  mononitrate  60 mg Oral Daily  . magnesium oxide  400 mg Oral TID WC  . multivitamin   Oral Q supper  . nystatin  5 mL Oral QID  . pravastatin  20 mg Oral QHS  . sevelamer carbonate  2.4 g Oral TID WC   Continuous Infusions:   LOS: 2 days    Lilyrose Tanney Tanna Furry, MD [facility] Pager [phone]  If 7PM-7AM, please contact night-coverage [email or web address] Password [facility] 02/02/2017, 4:31 PM

## 2017-02-02 NOTE — Progress Notes (Signed)
Dialysis treatment completed.  3900 mL ultrafiltrated and net fluid removal 3000 mL.    Patient status unchanged. Lung sounds diminished to ausculation in all fields. Generalized edema. Cardiac: NSR.  Disconnected lines and removed needles.  Pressure held for 10 minutes and band aid/gauze dressing applied.  Report given to bedside RN, Ellard Artis.

## 2017-02-02 NOTE — Progress Notes (Signed)
Pt undergoing dialysis  Comfortable   Waiting for records for cath from 2014 from [place] Would try to increase fluid removal at dialysis. Revewed with Willy Eddy (renal)

## 2017-02-02 NOTE — Progress Notes (Signed)
Patient arrived to unit per bed.  Reviewed treatment plan and this RN agrees.  Report received from bedside RN, Kami.  Consent verified.  Patient A & o X 4. Lung sounds diminished with friction rub to ausculation in all fields. No edema. Cardiac: NSR, BBB.  Prepped LLAVF with alcohol and cannulated with two 15 gauge needles.  Pulsation of blood noted.  Flushed access well with saline per protocol.  Connected and secured lines and initiated tx at 0807.  UF goal of 4500 mL and net fluid removal of 4000 mL.  Will continue to monitor.

## 2017-02-02 NOTE — Procedures (Signed)
I have seen and examined this patient and agree with the plan of care   Patient seen on dialysis  Seems to be tolerating ultrafiltration  BP s 120/70  P 80 s   Ca 8.9  K 3.4    Hb 9.4  [name] W 02/02/2017, 10:41 AM

## 2017-02-03 LAB — IRON AND TIBC
IRON: 38 ug/dL — AB (ref 45–182)
SATURATION RATIOS: 26 % (ref 17.9–39.5)
TIBC: 148 ug/dL — ABNORMAL LOW (ref 250–450)
UIBC: 110 ug/dL

## 2017-02-03 MED ORDER — CARVEDILOL 6.25 MG PO TABS: 6.2500 mg | ORAL_TABLET | Freq: Two times a day (BID) | ORAL | 0 refills | Status: AC

## 2017-02-03 MED ORDER — METHOCARBAMOL 500 MG PO TABS: 500.0000 mg | ORAL_TABLET | Freq: Three times a day (TID) | ORAL | 0 refills | Status: AC | PRN

## 2017-02-03 MED ORDER — METHOCARBAMOL 500 MG PO TABS
500.0000 mg | ORAL_TABLET | Freq: Three times a day (TID) | ORAL | Status: DC | PRN
  Administered 2017-02-03: 500 mg via ORAL
  Filled 2017-02-03: qty 1

## 2017-02-03 MED ORDER — NYSTATIN 100000 UNIT/ML MT SUSP: 5.0000 mL | Freq: Four times a day (QID) | OROMUCOSAL | 0 refills | Status: AC

## 2017-02-03 NOTE — Progress Notes (Signed)
Jeremy Kirby to be D/C'd Home per MD order.  Discussed prescriptions and follow up appointments with the patient. Prescriptions given to patient, medication list explained in detail. Pt verbalized understanding.  Allergies as of 02/03/2017   No Known Allergies     Medication List    STOP taking these medications   amLODipine 5 MG tablet Commonly known as:  NORVASC     TAKE these medications   acetaminophen 500 MG tablet Commonly known as:  TYLENOL Take 500 mg by mouth daily as needed (pain).   aspirin EC 81 MG tablet Take 81 mg by mouth daily.   carvedilol 6.25 MG tablet Commonly known as:  COREG Take 1 tablet (6.25 mg total) by mouth 2 (two) times daily. What changed:  medication strength  how much to take  additional instructions  Another medication with the same name was removed. Continue taking this medication, and follow the directions you see here.   cinacalcet 60 MG tablet Commonly known as:  SENSIPAR Take 60 mg by mouth daily with supper.   DIALYVITE 800 WITH ZINC 0.8 MG Tabs Take 1 tablet by mouth daily with supper.   docusate sodium 100 MG capsule Commonly known as:  COLACE Take 100 mg by mouth 2 (two) times daily.   fluticasone 50 MCG/ACT nasal spray Commonly known as:  FLONASE Place 2 sprays into both nostrils daily as needed (congestion).   hydrOXYzine 25 MG tablet Commonly known as:  ATARAX/VISTARIL Take 25 mg by mouth every 8 (eight) hours as needed for itching.   isosorbide mononitrate 60 MG 24 hr tablet Commonly known as:  IMDUR Take 60 mg by mouth daily.   magnesium oxide 400 MG tablet Commonly known as:  MAG-OX Take 400 mg by mouth 3 (three) times daily with meals.   methocarbamol 500 MG tablet Commonly known as:  ROBAXIN Take 1 tablet (500 mg total) by mouth every 8 (eight) hours as needed for muscle spasms.   nitroGLYCERIN 0.4 MG SL tablet Commonly known as:  NITROSTAT Place 0.4 mg under the tongue every 5 (five) minutes as needed  for chest pain.   nystatin 100000 UNIT/ML suspension Commonly known as:  MYCOSTATIN Take 5 mLs (500,000 Units total) by mouth 4 (four) times daily.   polyethylene glycol packet Commonly known as:  MIRALAX / GLYCOLAX Take 17 g by mouth daily as needed (constipation). Mix in 8 oz of liquid and drink   pravastatin 40 MG tablet Commonly known as:  PRAVACHOL Take 40 mg by mouth at bedtime.   ranitidine 150 MG tablet Commonly known as:  ZANTAC Take 150 mg by mouth See admin instructions. Take 1 tablet (150 mg) by mouth daily before breakfast, may also take a 2nd tablet later in the day as needed for acid reflux   RENVELA 2.4 g Pack Generic drug:  sevelamer carbonate Take 2.4 g by mouth 3 (three) times daily with meals. Mix 1 packet in 2 oz of water and drink       Vitals:   02/03/17 0445 02/03/17 0941  BP: 117/72 118/78  Pulse: 85 82  Resp: 14 16  Temp: (!) 97.3 F (36.3 C) 98 F (36.7 C)    Skin clean, dry and intact without evidence of skin break down, no evidence of skin tears noted. IV catheter discontinued intact. Site without signs and symptoms of complications. Dressing and pressure applied. Pt denies pain at this time. No complaints noted.  An After Visit Summary was printed and given to the  patient. Patient escorted via [place], and D/C home via private auto.  Haywood Lasso BSN, RN [facility] Phone [phone]

## 2017-02-03 NOTE — Progress Notes (Signed)
Progress Note  Patient Name: Jeremy Kirby Date of Encounter: 02/03/2017  Primary Cardiologist: [place] [facility]  A little SOB with activity    Inpatient Medications    Scheduled Meds: . aspirin EC  81 mg Oral Daily  . carvedilol  6.25 mg Oral BID  . cinacalcet  60 mg Oral Q supper  . docusate sodium  100 mg Oral BID  . doxercalciferol  1 mcg Intravenous Q M,W,F-HD  . famotidine  20 mg Oral Daily  . feeding supplement (NEPRO CARB STEADY)  237 mL Oral BID BM  . heparin  5,000 Units Subcutaneous Q8H  . isosorbide mononitrate  60 mg Oral Daily  . magnesium oxide  400 mg Oral TID WC  . multivitamin   Oral Q supper  . nystatin  5 mL Oral QID  . pravastatin  20 mg Oral QHS  . sevelamer carbonate  2.4 g Oral TID WC   Continuous Infusions:  PRN Meds: acetaminophen, albuterol, dextromethorphan-guaiFENesin, fluticasone, hydrALAZINE, hydrOXYzine, nitroGLYCERIN, polyethylene glycol, zolpidem   Vital Signs    Vitals:   02/02/17 1700 02/02/17 2138 02/03/17 0445 02/03/17 0500  BP: 105/63 107/61 117/72   Pulse: 91 80 85   Resp: 18 16 14    Temp: 98.6 F (37 C) 98.3 F (36.8 C) (!) 97.3 F (36.3 C)   TempSrc: Oral Oral Oral   SpO2: 97% 100% 98%   Weight:    163 lb 5.8 oz (74.1 kg)  Height:        Intake/Output Summary (Last 24 hours) at 02/03/17 0831 Last data filed at 02/03/17 3818  Gross per 24 hour  Intake              120 ml  Output             3000 ml  Net            -2880 ml   Filed Weights   02/02/17 0750 02/02/17 1237 02/03/17 0500  Weight: 169 lb 15.6 oz (77.1 kg) 163 lb 5.8 oz (74.1 kg) 163 lb 5.8 oz (74.1 kg)    Telemetry      ECG      Physical Exam   GEN: No acute distress.   Neck: No JVD Cardiac: RRR, no murmurs, rubs, or gallops.  Respiratory: Clear to auscultation bilaterally. GI: Soft, nontender, non-distended  MS: No edema; No deformity. Neuro:  Nonfocal  Psych: Normal affect   Labs     Chemistry Recent Labs Lab 01/30/17 2145 02/01/17 0412  NA 138 136  K 3.4* 3.4*  CL 98* 98*  CO2 29 27  GLUCOSE 96 89  BUN 34* 18  CREATININE 10.60* 7.25*  CALCIUM 9.5 8.9  GFRNONAA 4* 7*  GFRAA 5* 8*  ANIONGAP 11 11     Hematology Recent Labs Lab 01/30/17 2145 02/02/17 0630  WBC 4.9 5.5  RBC 3.08* 3.03*  HGB 9.9* 9.4*  HCT 30.6* 29.5*  MCV 99.4 97.4  MCH 32.1 31.0  MCHC 32.4 31.9  RDW 15.8* 15.1  PLT 164 174    Cardiac EnzymesNo results for input(s): TROPONINI in the last 168 hours. No results for input(s): TROPIPOC in the last 168 hours.   BNPNo results for input(s): BNP, PROBNP in the last 168 hours.   DDimer No results for input(s): DDIMER in the last 168 hours.   Radiology    No results found.  Cardiac Studies    Patient Profile  66 y.o. male CAD, HTN, ESRD  Presented to eval of SOB    Assessment & Plan    1 Acute on chronic systolic CHF  LVEF 25 to 71%  Similar to echo from October 2017 Volume status is not bad   I feel that the pt needs a new dry wt set for dialysis   I am not convinced of a worsening CAD    2  CAD  S/P CABG  2010  Cath in 2014  [place] waiting for records   3  ESRD  Dialyssi  MWF  I think it is OK to d/c  WIll still try to get records   Follow up with local cardiologist and also [facility] for Abdominal aorta.   Signed, Dorris Carnes, MD  02/03/2017, 8:31 AM

## 2017-02-03 NOTE — Discharge Summary (Addendum)
Physician Discharge Summary  Jeremy Kirby HQP:[id number] DOB: 1950/12/30 DOA: 01/30/2017  PCP: Patient, No Pcp Per  Admit date: 01/30/2017 Discharge date: 02/03/2017  Admitted From:home Disposition:home  Recommendations for Outpatient Follow-up:  1. Follow up with PCP and Cardiology in 1-2 weeks 2. Please obtain BMP/CBC in one week  Home Health:no Equipment/Devices:no Discharge Condition:stable CODE STATUS:full code Diet recommendation:heart healthy  Brief/Interim Summary: 66 year old male with history of hypertension, dyslipidemia, GERD, small bowel obstruction, coronary artery disease status post CABG, AAA, GI bleeding, lymphoma in remission presented with shortness of breath and dry cough. No fever, no leukocytosis.  #Acute respiratory failure with hypoxia: In the setting of congestive heart failure and fluid overload. Initially thought to be due to pneumonia which is ruled out. Patient reported feeling much better today. Receiving hemodialysis treatment. He is on room air.  # Acute on chronic combined systolic and diastolic congestive heart failure: Echocardiogram done on July 25 consistent with EF of 25-30% with severe diffuse hypokinesis and grade 2 diastolic dysfunction. Evaluated by cardiologist. Patient should hemodialysis treatment with improvement in symptoms. May need to adjust dry weight. Medication adjusted including discontinuation of amlodipine and lowering the dose of Coreg by cardiologist. Patient reported feeling better. He will follow-up with his own cardiologist after discharge. I recommended continue current medication, low-salt diet and increase ultrafiltration during dialysis by nephrologist.  #Essential hypertension: Blood pressure acceptable. Monitor blood pressure and continue current cardiac medications  #ESRD on hemodialysis: Continue hemodialysis treatment. Recommended increase ultrafiltration and adjusting dry weight.  #GERD: Continue  Pepcid  #Dyslipidemia: Continue pravastatin  #Hypokalemia: Patient is a hemodialysis patient. Monitor BMP.  #Moderate protein calorie malnutrition: Dietary referral, feeding supplement and supportive care  #OSA: Continue CPAP at night  #Anemia of chronic kidney disease: Monitor CBC. No sign of bleeding.  #Oral candidiasis: Started nystatin swish and swallow.  Patient is clinically improved. Discussed with the patient and his wife at bedside. Verbalized understanding of follow-up instructions.  Discharge Diagnoses:  Principal Problem:   Acute on chronic combined systolic and diastolic CHF (congestive heart failure) (HCC)  Active Problems:   ESRD on dialysis (HCC)   HTN (hypertension)   Lymphoma (HCC)   CAD (coronary artery disease)   HLD (hyperlipidemia)   GERD (gastroesophageal reflux disease)   Hypokalemia   Malnutrition of moderate degree      Discharge Instructions  Discharge Instructions    (HEART FAILURE PATIENTS) Call MD:  Anytime you have any of the following symptoms: 1) 3 pound weight gain in 24 hours or 5 pounds in 1 week 2) shortness of breath, with or without a dry hacking cough 3) swelling in the hands, feet or stomach 4) if you have to sleep on extra pillows at night in order to breathe.    Complete by:  As directed    Call MD for:  difficulty breathing, headache or visual disturbances    Complete by:  As directed    Call MD for:  extreme fatigue    Complete by:  As directed    Call MD for:  hives    Complete by:  As directed    Call MD for:  persistant dizziness or light-headedness    Complete by:  As directed    Call MD for:  persistant nausea and vomiting    Complete by:  As directed    Call MD for:  severe uncontrolled pain    Complete by:  As directed    Call MD for:  temperature >100.4  Complete by:  As directed    Diet - low sodium heart healthy    Complete by:  As directed    Discharge instructions    Complete by:  As directed     Please follow up with your primary care doctor and cardiologist in 1-2 weeks. Please continue your hemodialysis treatment.   Increase activity slowly    Complete by:  As directed      Allergies as of 02/03/2017   No Known Allergies     Medication List    STOP taking these medications   amLODipine 5 MG tablet Commonly known as:  NORVASC     TAKE these medications   acetaminophen 500 MG tablet Commonly known as:  TYLENOL Take 500 mg by mouth daily as needed (pain).   aspirin EC 81 MG tablet Take 81 mg by mouth daily.   carvedilol 6.25 MG tablet Commonly known as:  COREG Take 1 tablet (6.25 mg total) by mouth 2 (two) times daily. What changed:  medication strength  how much to take  additional instructions  Another medication with the same name was removed. Continue taking this medication, and follow the directions you see here.   cinacalcet 60 MG tablet Commonly known as:  SENSIPAR Take 60 mg by mouth daily with supper.   DIALYVITE 800 WITH ZINC 0.8 MG Tabs Take 1 tablet by mouth daily with supper.   docusate sodium 100 MG capsule Commonly known as:  COLACE Take 100 mg by mouth 2 (two) times daily.   fluticasone 50 MCG/ACT nasal spray Commonly known as:  FLONASE Place 2 sprays into both nostrils daily as needed (congestion).   hydrOXYzine 25 MG tablet Commonly known as:  ATARAX/VISTARIL Take 25 mg by mouth every 8 (eight) hours as needed for itching.   isosorbide mononitrate 60 MG 24 hr tablet Commonly known as:  IMDUR Take 60 mg by mouth daily.   magnesium oxide 400 MG tablet Commonly known as:  MAG-OX Take 400 mg by mouth 3 (three) times daily with meals.   methocarbamol 500 MG tablet Commonly known as:  ROBAXIN Take 1 tablet (500 mg total) by mouth every 8 (eight) hours as needed for muscle spasms.   nitroGLYCERIN 0.4 MG SL tablet Commonly known as:  NITROSTAT Place 0.4 mg under the tongue every 5 (five) minutes as needed for chest pain.    nystatin 100000 UNIT/ML suspension Commonly known as:  MYCOSTATIN Take 5 mLs (500,000 Units total) by mouth 4 (four) times daily.   polyethylene glycol packet Commonly known as:  MIRALAX / GLYCOLAX Take 17 g by mouth daily as needed (constipation). Mix in 8 oz of liquid and drink   pravastatin 40 MG tablet Commonly known as:  PRAVACHOL Take 40 mg by mouth at bedtime.   ranitidine 150 MG tablet Commonly known as:  ZANTAC Take 150 mg by mouth See admin instructions. Take 1 tablet (150 mg) by mouth daily before breakfast, may also take a 2nd tablet later in the day as needed for acid reflux   RENVELA 2.4 g Pack Generic drug:  sevelamer carbonate Take 2.4 g by mouth 3 (three) times daily with meals. Mix 1 packet in 2 oz of water and drink       No Known Allergies  Consultations: Cardiology Nephrology  Procedures/Studies: None  Subjective: Seen and examined at bedside. Reported feeling good. Denies headache, dizziness, nausea vomiting chest pain or shortness of breath. Wife at bedside. Discharge Exam: Vitals:   02/03/17 0445 02/03/17  0941  BP: 117/72 118/78  Pulse: 85 82  Resp: 14 16  Temp: (!) 97.3 F (36.3 C) 98 F (36.7 C)   Vitals:   02/02/17 2138 02/03/17 0445 02/03/17 0500 02/03/17 0941  BP: 107/61 117/72  118/78  Pulse: 80 85  82  Resp: 16 14  16   Temp: 98.3 F (36.8 C) (!) 97.3 F (36.3 C)  98 F (36.7 C)  TempSrc: Oral Oral  Oral  SpO2: 100% 98%  100%  Weight:   74.1 kg (163 lb 5.8 oz)   Height:        General: Pt is alert, awake, not in acute distress Cardiovascular: RRR, S1/S2 +, no rubs, no gallops Respiratory: CTA bilaterally, no wheezing, no rhonchi Abdominal: Soft, NT, ND, bowel sounds + Extremities: no edema, no cyanosis    The results of significant diagnostics from this hospitalization (including imaging, microbiology, ancillary and laboratory) are listed below for reference.     Microbiology: Recent Results (from the past 240  hour(s))  Culture, blood (Routine X 2) w Reflex to ID Panel     Status: None (Preliminary result)   Collection Time: 01/30/17  9:45 PM  Result Value Ref Range Status   Specimen Description BLOOD RIGHT ANTECUBITAL  Final   Special Requests   Final    BOTTLES DRAWN AEROBIC AND ANAEROBIC Blood Culture adequate volume   Culture NO GROWTH 3 DAYS  Final   Report Status PENDING  Incomplete  Culture, blood (Routine X 2) w Reflex to ID Panel     Status: None (Preliminary result)   Collection Time: 01/30/17  9:53 PM  Result Value Ref Range Status   Specimen Description BLOOD RIGHT HAND  Final   Special Requests   Final    BOTTLES DRAWN AEROBIC AND ANAEROBIC Blood Culture adequate volume   Culture NO GROWTH 3 DAYS  Final   Report Status PENDING  Incomplete  Respiratory Panel by PCR     Status: None   Collection Time: 01/31/17  1:51 AM  Result Value Ref Range Status   Adenovirus NOT DETECTED NOT DETECTED Final   Coronavirus 229E NOT DETECTED NOT DETECTED Final   Coronavirus HKU1 NOT DETECTED NOT DETECTED Final   Coronavirus NL63 NOT DETECTED NOT DETECTED Final   Coronavirus OC43 NOT DETECTED NOT DETECTED Final   Metapneumovirus NOT DETECTED NOT DETECTED Final   Rhinovirus / Enterovirus NOT DETECTED NOT DETECTED Final   Influenza A NOT DETECTED NOT DETECTED Final   Influenza B NOT DETECTED NOT DETECTED Final   Parainfluenza Virus 1 NOT DETECTED NOT DETECTED Final   Parainfluenza Virus 2 NOT DETECTED NOT DETECTED Final   Parainfluenza Virus 3 NOT DETECTED NOT DETECTED Final   Parainfluenza Virus 4 NOT DETECTED NOT DETECTED Final   Respiratory Syncytial Virus NOT DETECTED NOT DETECTED Final   Bordetella pertussis NOT DETECTED NOT DETECTED Final   Chlamydophila pneumoniae NOT DETECTED NOT DETECTED Final   Mycoplasma pneumoniae NOT DETECTED NOT DETECTED Final  MRSA PCR Screening     Status: None   Collection Time: 01/31/17  1:51 AM  Result Value Ref Range Status   MRSA by PCR NEGATIVE NEGATIVE  Final    Comment:        The GeneXpert MRSA Assay (FDA approved for NASAL specimens only), is one component of a comprehensive MRSA colonization surveillance program. It is not intended to diagnose MRSA infection nor to guide or monitor treatment for MRSA infections.      Labs: BNP (last 3  results) No results for input(s): BNP in the last 8760 hours. Basic Metabolic Panel:  Recent Labs Lab 01/30/17 2145 02/01/17 0412  NA 138 136  K 3.4* 3.4*  CL 98* 98*  CO2 29 27  GLUCOSE 96 89  BUN 34* 18  CREATININE 10.60* 7.25*  CALCIUM 9.5 8.9   Liver Function Tests: No results for input(s): AST, ALT, ALKPHOS, BILITOT, PROT, ALBUMIN in the last 168 hours. No results for input(s): LIPASE, AMYLASE in the last 168 hours. No results for input(s): AMMONIA in the last 168 hours. CBC:  Recent Labs Lab 01/30/17 2145 02/02/17 0630  WBC 4.9 5.5  NEUTROABS 2.8  --   HGB 9.9* 9.4*  HCT 30.6* 29.5*  MCV 99.4 97.4  PLT 164 174   Cardiac Enzymes: No results for input(s): CKTOTAL, CKMB, CKMBINDEX, TROPONINI in the last 168 hours. BNP: Invalid input(s): POCBNP CBG: No results for input(s): GLUCAP in the last 168 hours. D-Dimer No results for input(s): DDIMER in the last 72 hours. Hgb A1c No results for input(s): HGBA1C in the last 72 hours. Lipid Profile No results for input(s): CHOL, HDL, LDLCALC, TRIG, CHOLHDL, LDLDIRECT in the last 72 hours. Thyroid function studies No results for input(s): TSH, T4TOTAL, T3FREE, THYROIDAB in the last 72 hours.  Invalid input(s): FREET3 Anemia work up  [employer]  02/03/17 0628  TIBC 148*  IRON 38*   Urinalysis No results found for: COLORURINE, APPEARANCEUR, LABSPEC, PHURINE, GLUCOSEU, HGBUR, BILIRUBINUR, KETONESUR, PROTEINUR, UROBILINOGEN, NITRITE, LEUKOCYTESUR Sepsis Labs Invalid input(s): PROCALCITONIN,  WBC,  LACTICIDVEN Microbiology Recent Results (from the past 240 hour(s))  Culture, blood (Routine X 2) w Reflex to ID Panel      Status: None (Preliminary result)   Collection Time: 01/30/17  9:45 PM  Result Value Ref Range Status   Specimen Description BLOOD RIGHT ANTECUBITAL  Final   Special Requests   Final    BOTTLES DRAWN AEROBIC AND ANAEROBIC Blood Culture adequate volume   Culture NO GROWTH 3 DAYS  Final   Report Status PENDING  Incomplete  Culture, blood (Routine X 2) w Reflex to ID Panel     Status: None (Preliminary result)   Collection Time: 01/30/17  9:53 PM  Result Value Ref Range Status   Specimen Description BLOOD RIGHT HAND  Final   Special Requests   Final    BOTTLES DRAWN AEROBIC AND ANAEROBIC Blood Culture adequate volume   Culture NO GROWTH 3 DAYS  Final   Report Status PENDING  Incomplete  Respiratory Panel by PCR     Status: None   Collection Time: 01/31/17  1:51 AM  Result Value Ref Range Status   Adenovirus NOT DETECTED NOT DETECTED Final   Coronavirus 229E NOT DETECTED NOT DETECTED Final   Coronavirus HKU1 NOT DETECTED NOT DETECTED Final   Coronavirus NL63 NOT DETECTED NOT DETECTED Final   Coronavirus OC43 NOT DETECTED NOT DETECTED Final   Metapneumovirus NOT DETECTED NOT DETECTED Final   Rhinovirus / Enterovirus NOT DETECTED NOT DETECTED Final   Influenza A NOT DETECTED NOT DETECTED Final   Influenza B NOT DETECTED NOT DETECTED Final   Parainfluenza Virus 1 NOT DETECTED NOT DETECTED Final   Parainfluenza Virus 2 NOT DETECTED NOT DETECTED Final   Parainfluenza Virus 3 NOT DETECTED NOT DETECTED Final   Parainfluenza Virus 4 NOT DETECTED NOT DETECTED Final   Respiratory Syncytial Virus NOT DETECTED NOT DETECTED Final   Bordetella pertussis NOT DETECTED NOT DETECTED Final   Chlamydophila pneumoniae NOT DETECTED NOT DETECTED  Final   Mycoplasma pneumoniae NOT DETECTED NOT DETECTED Final  MRSA PCR Screening     Status: None   Collection Time: 01/31/17  1:51 AM  Result Value Ref Range Status   MRSA by PCR NEGATIVE NEGATIVE Final    Comment:        The GeneXpert MRSA Assay  (FDA approved for NASAL specimens only), is one component of a comprehensive MRSA colonization surveillance program. It is not intended to diagnose MRSA infection nor to guide or monitor treatment for MRSA infections.      Time coordinating discharge: 30 minutes  SIGNED:   Rosita Fire, MD  [facility] 02/03/2017, 11:56 AM  If 7PM-7AM, please contact night-coverage [email or web address] Password [email or web address]

## 2017-02-03 NOTE — Progress Notes (Signed)
[facility] ROUNDING NOTE   Subjective:   HPI:66 y.o.malewith medical history significant of hypertension, hyperlipidemia, GERD, small bowel obstruction, CAD, s/p of CABG, AAA, GI bleeding, lymphomain remission, who presents with shortness of breath and productive and a right infiltrate that is thought to be pneumonia. He is no longer on antibiotics  He receives dialysis MWF, his blood pressure looks fairly well controlled and dialyzes with a fistula.  He had no increase in white count and is not febrile. I wonder if the infiltrate is related to volume overload and needs challenging of his dry weight. He denies any cardiac symptoms and has had a CABG in the past   Objective:  Vital signs in last 24 hours:  Temp:  [97.3 F (36.3 C)-98.6 F (37 C)] 97.3 F (36.3 C) (07/28 0445) Pulse Rate:  [72-91] 85 (07/28 0445) Resp:  [14-18] 14 (07/28 0445) BP: (105-135)/(61-82) 117/72 (07/28 0445) SpO2:  [97 %-100 %] 98 % (07/28 0445) Weight:  [163 lb 5.8 oz (74.1 kg)-169 lb 15.6 oz (77.1 kg)] 163 lb 5.8 oz (74.1 kg) (07/28 0500)  Weight change: 1 lb 8.7 oz (0.7 kg) Filed Weights   02/02/17 0750 02/02/17 1237 02/03/17 0500  Weight: 169 lb 15.6 oz (77.1 kg) 163 lb 5.8 oz (74.1 kg) 163 lb 5.8 oz (74.1 kg)    Intake/Output: I/O last 3 completed shifts: In: 840 [P.O.:840] Out: 3000 [Other:3000]   Intake/Output this shift:  Total I/O In: 120 [P.O.:120] Out: 0   CVS- RRR RS-  Clear no wheezes or rales  Using oxygen  ABD- BS present soft non-distended EXT- no edema   Basic Metabolic Panel:  Recent Labs Lab 01/30/17 2145 02/01/17 0412  NA 138 136  K 3.4* 3.4*  CL 98* 98*  CO2 29 27  GLUCOSE 96 89  BUN 34* 18  CREATININE 10.60* 7.25*  CALCIUM 9.5 8.9    Liver Function Tests: No results for input(s): AST, ALT, ALKPHOS, BILITOT, PROT, ALBUMIN in the last 168 hours. No results for input(s): LIPASE, AMYLASE in the last 168 hours. No results for input(s):  AMMONIA in the last 168 hours.  CBC:  Recent Labs Lab 01/30/17 2145 02/02/17 0630  WBC 4.9 5.5  NEUTROABS 2.8  --   HGB 9.9* 9.4*  HCT 30.6* 29.5*  MCV 99.4 97.4  PLT 164 174    Cardiac Enzymes: No results for input(s): CKTOTAL, CKMB, CKMBINDEX, TROPONINI in the last 168 hours.  BNP: Invalid input(s): POCBNP  CBG: No results for input(s): GLUCAP in the last 168 hours.  Microbiology: Results for orders placed or performed during the hospital encounter of 01/30/17  Culture, blood (Routine X 2) w Reflex to ID Panel     Status: None (Preliminary result)   Collection Time: 01/30/17  9:45 PM  Result Value Ref Range Status   Specimen Description BLOOD RIGHT ANTECUBITAL  Final   Special Requests   Final    BOTTLES DRAWN AEROBIC AND ANAEROBIC Blood Culture adequate volume   Culture NO GROWTH 3 DAYS  Final   Report Status PENDING  Incomplete  Culture, blood (Routine X 2) w Reflex to ID Panel     Status: None (Preliminary result)   Collection Time: 01/30/17  9:53 PM  Result Value Ref Range Status   Specimen Description BLOOD RIGHT HAND  Final   Special Requests   Final    BOTTLES DRAWN AEROBIC AND ANAEROBIC Blood Culture adequate volume   Culture NO GROWTH 3 DAYS  Final  Report Status PENDING  Incomplete  Respiratory Panel by PCR     Status: None   Collection Time: 01/31/17  1:51 AM  Result Value Ref Range Status   Adenovirus NOT DETECTED NOT DETECTED Final   Coronavirus 229E NOT DETECTED NOT DETECTED Final   Coronavirus HKU1 NOT DETECTED NOT DETECTED Final   Coronavirus NL63 NOT DETECTED NOT DETECTED Final   Coronavirus OC43 NOT DETECTED NOT DETECTED Final   Metapneumovirus NOT DETECTED NOT DETECTED Final   Rhinovirus / Enterovirus NOT DETECTED NOT DETECTED Final   Influenza A NOT DETECTED NOT DETECTED Final   Influenza B NOT DETECTED NOT DETECTED Final   Parainfluenza Virus 1 NOT DETECTED NOT DETECTED Final   Parainfluenza Virus 2 NOT DETECTED NOT DETECTED Final    Parainfluenza Virus 3 NOT DETECTED NOT DETECTED Final   Parainfluenza Virus 4 NOT DETECTED NOT DETECTED Final   Respiratory Syncytial Virus NOT DETECTED NOT DETECTED Final   Bordetella pertussis NOT DETECTED NOT DETECTED Final   Chlamydophila pneumoniae NOT DETECTED NOT DETECTED Final   Mycoplasma pneumoniae NOT DETECTED NOT DETECTED Final  MRSA PCR Screening     Status: None   Collection Time: 01/31/17  1:51 AM  Result Value Ref Range Status   MRSA by PCR NEGATIVE NEGATIVE Final    Comment:        The GeneXpert MRSA Assay (FDA approved for NASAL specimens only), is one component of a comprehensive MRSA colonization surveillance program. It is not intended to diagnose MRSA infection nor to guide or monitor treatment for MRSA infections.     Coagulation Studies: No results for input(s): LABPROT, INR in the last 72 hours.  Urinalysis: No results for input(s): COLORURINE, LABSPEC, PHURINE, GLUCOSEU, HGBUR, BILIRUBINUR, KETONESUR, PROTEINUR, UROBILINOGEN, NITRITE, LEUKOCYTESUR in the last 72 hours.  Invalid input(s): APPERANCEUR    Imaging: No results found.   Medications:    . aspirin EC  81 mg Oral Daily  . carvedilol  6.25 mg Oral BID  . cinacalcet  60 mg Oral Q supper  . docusate sodium  100 mg Oral BID  . doxercalciferol  1 mcg Intravenous Q M,W,F-HD  . famotidine  20 mg Oral Daily  . feeding supplement (NEPRO CARB STEADY)  237 mL Oral BID BM  . heparin  5,000 Units Subcutaneous Q8H  . isosorbide mononitrate  60 mg Oral Daily  . magnesium oxide  400 mg Oral TID WC  . multivitamin   Oral Q supper  . nystatin  5 mL Oral QID  . pravastatin  20 mg Oral QHS  . sevelamer carbonate  2.4 g Oral TID WC   acetaminophen, albuterol, dextromethorphan-guaiFENesin, fluticasone, hydrALAZINE, hydrOXYzine, nitroGLYCERIN, polyethylene glycol, zolpidem  Assessment/ Plan:   ESRD- MWF dialysis  - [facility]  ANEMIA- Hb 9.4  Slight decrease from baseline will follow and  check iron stores  MBD- follow calcium and phosphorus and adjust vitamin D  ( calcium 9.5)   HTN/VOL- continue to challenge EDW   ACCESS- AVF with thrill appears to be functioning well     LOS: 3 Jeremy Kirby W @TODAY @5 :55 AM

## 2017-02-04 LAB — CULTURE, BLOOD (ROUTINE X 2)
CULTURE: NO GROWTH
Culture: NO GROWTH
Special Requests: ADEQUATE
Special Requests: ADEQUATE

## 2018-05-31 IMAGING — DX DG CHEST 2V
2 series · 2 of 2 positions shown · non-contrast
Comparison: None

CLINICAL DATA: Shortness of breath for 1 month, hypertension,
end-stage renal disease on dialysis, GERD, history lymphoma

EXAM:
CHEST  2 VIEW

[chest pa]
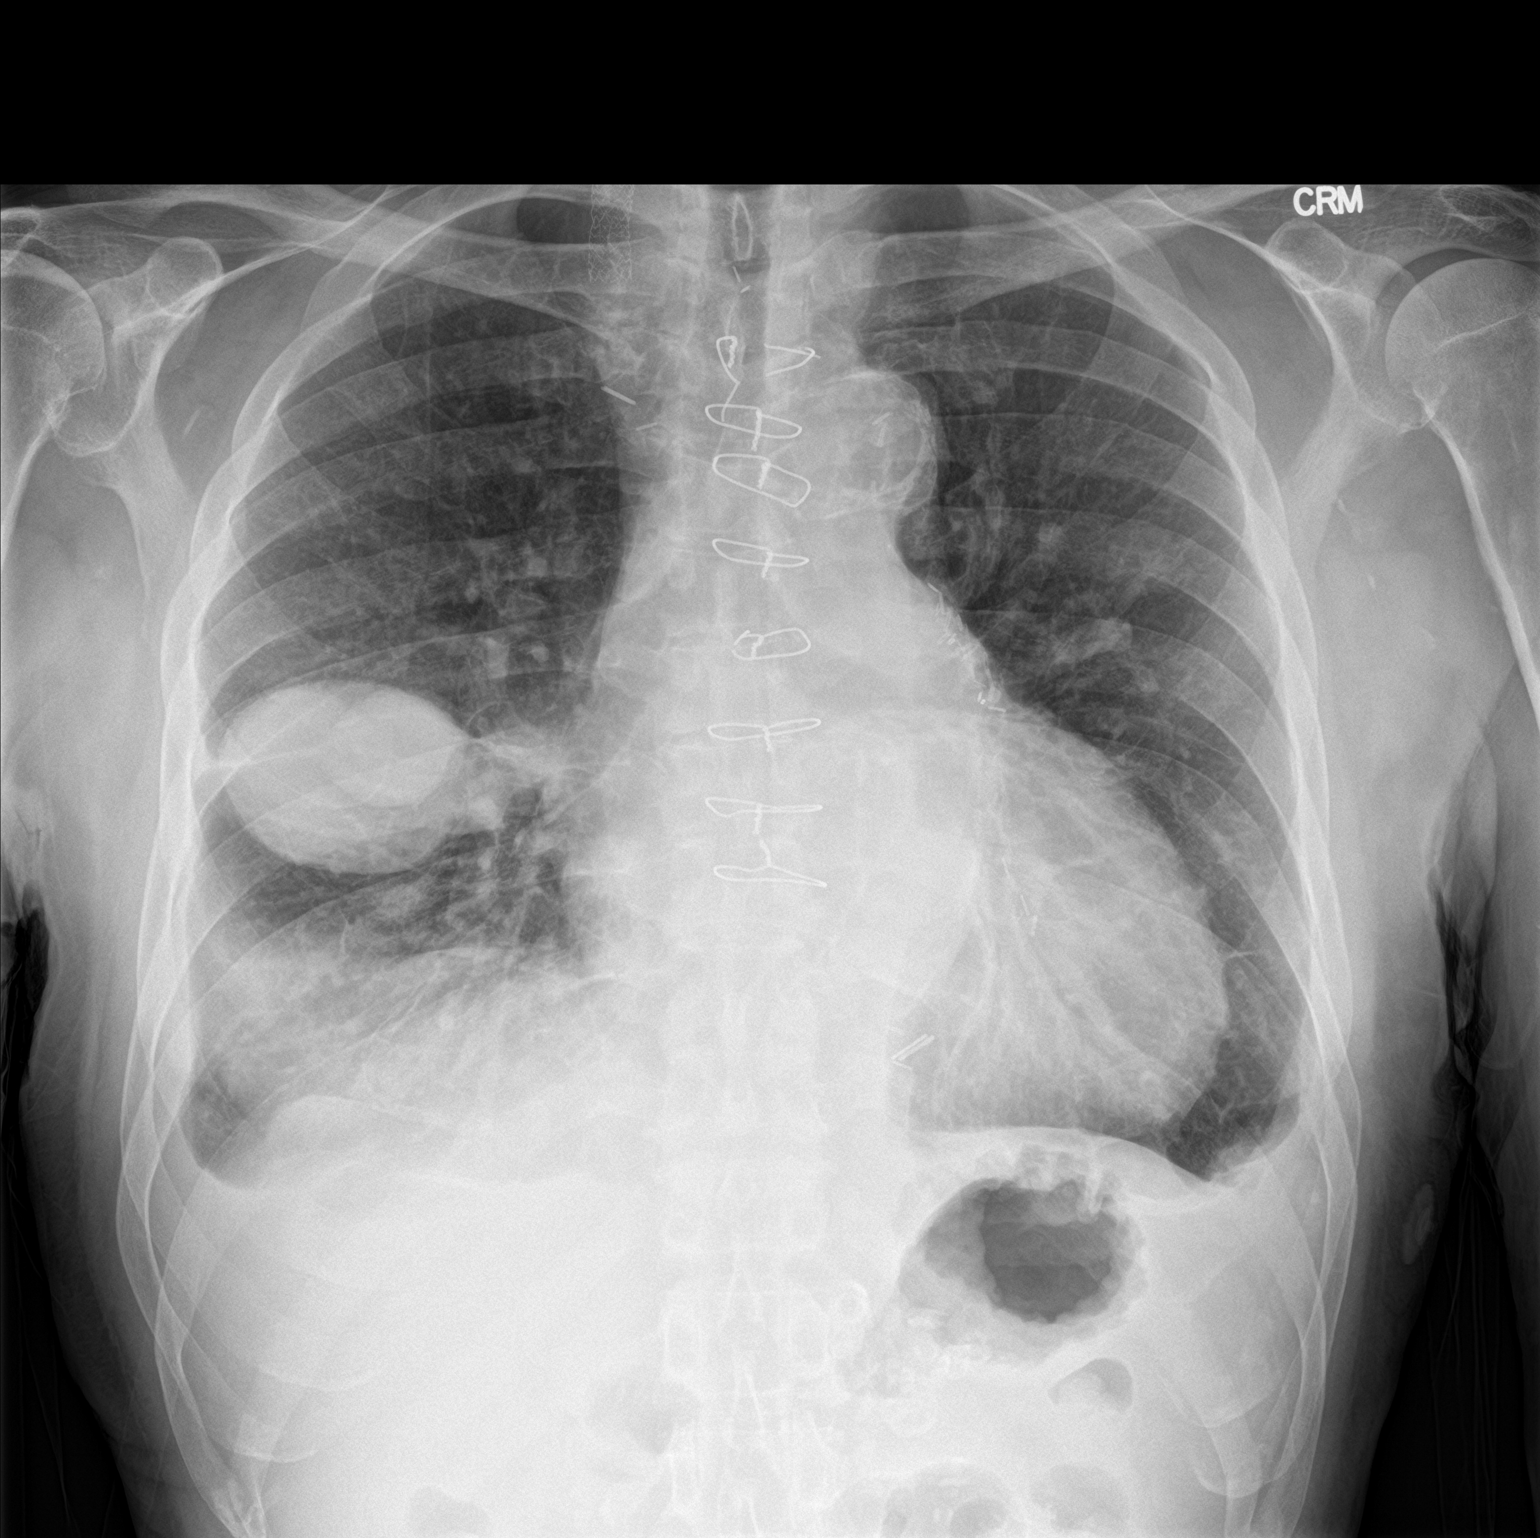

[chest lat]
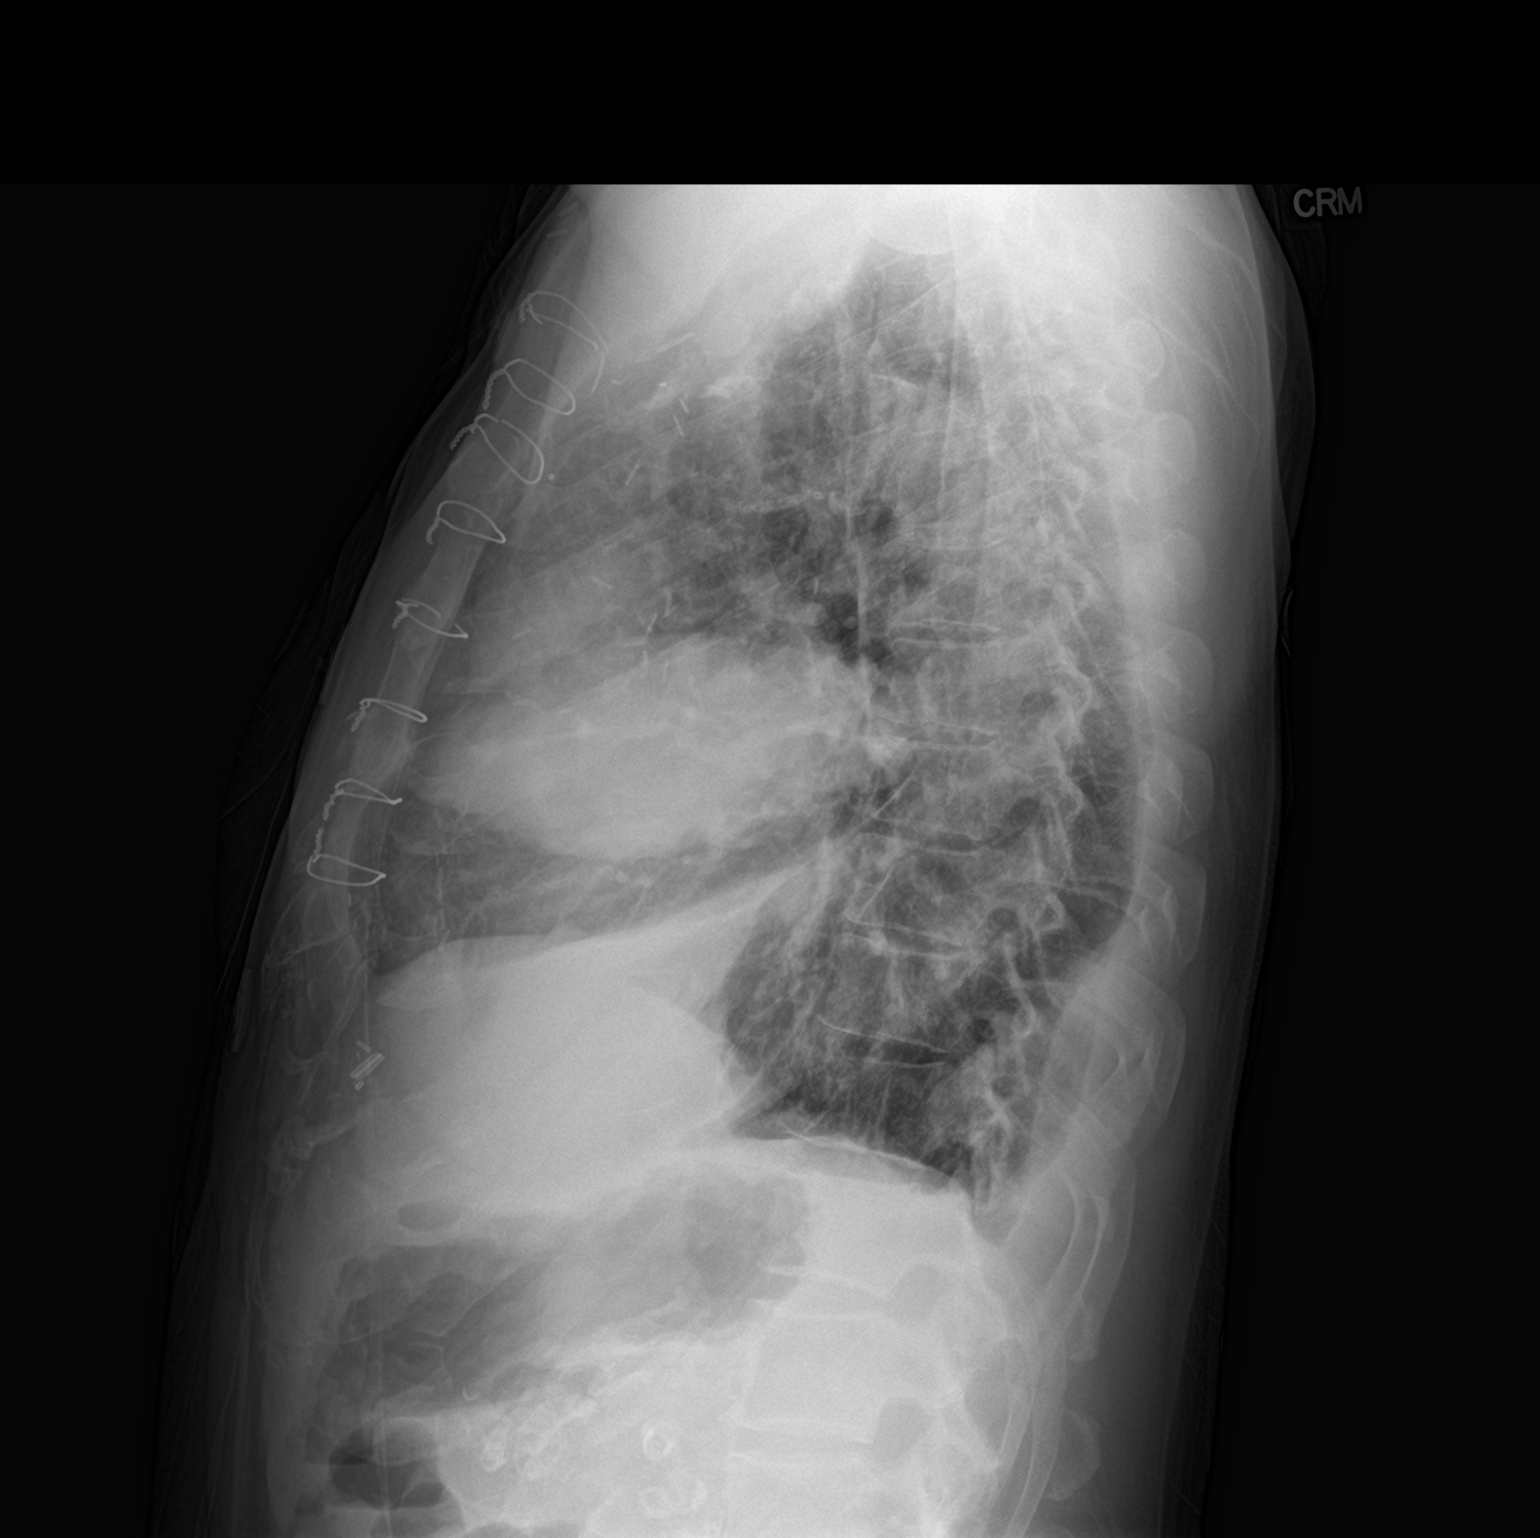

[2 of 2 positions shown; findings below may reference images not displayed]

FINDINGS: Enlargement of cardiac silhouette with slight vascular congestion.

Calcified tortuous thoracic aorta.

Mediastinal contours normal.

BILATERAL basilar pleural effusions and atelectasis.

Large ovoid opacity in the RIGHT mid lung suspect pseudotumor/fluid
loculated in the minor fissure.

Minimal peribronchial thickening and perihilar saturation on
markings, cannot exclude minimal edema.

No pneumothorax.

Stent identified in the RIGHT cervical region.

No acute osseous findings.
IMPRESSION: Enlargement of cardiac silhouette with pulmonary vascular
congestion.

RIGHT pleural effusion with suspect loculated fluid in minor
fissure/pseudotumor.

Bibasilar atelectasis and small LEFT pleural effusion with question
minimal perihilar edema.

Aortic Atherosclerosis ([id number]-NI7.7) and Emphysema ([id number].A).

## 2018-05-31 IMAGING — CT CT CHEST W/ CM
2 of 3 series · 15 of 36 positions shown, 18 images · IV contrast (iopamidol)
Comparison: Radiograph earlier this day.

CLINICAL DATA: Productive cough and shortness of breath.

EXAM:
CT CHEST WITH CONTRAST
TECHNIQUE: Multidetector CT imaging of the chest was performed during
intravenous contrast administration.
CONTRAST:  75mL [id number] IOPAMIDOL ([id number]) INJECTION 61%

[Series 3: chest with 2mm st · axial · 0.82mm/px · z∈[+1294,+1594]mm · 12 of 178 slices shown, 15 images]
[im 14/178  mediastinal]
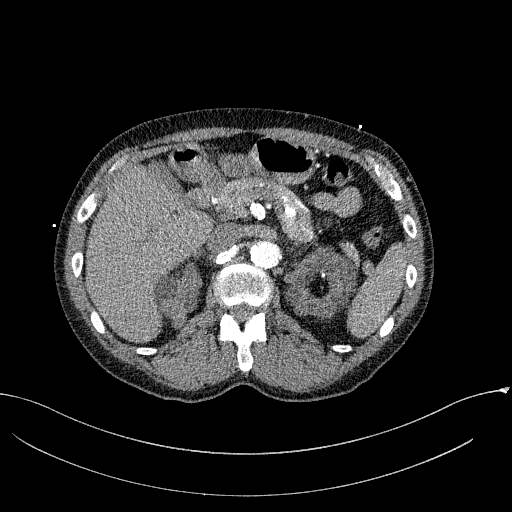
[im 14/178  lung]
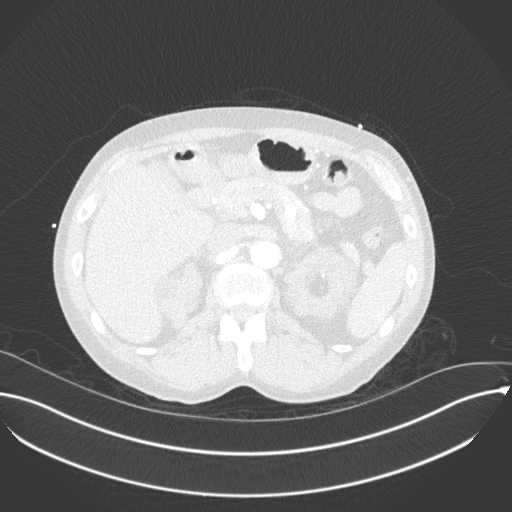
[im 27/178  lung]
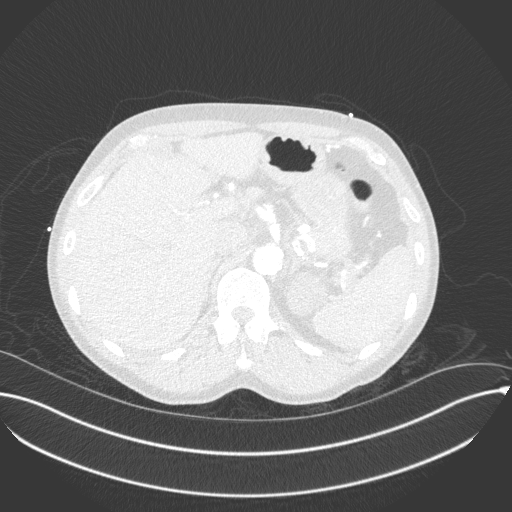
[im 40/178  lung]
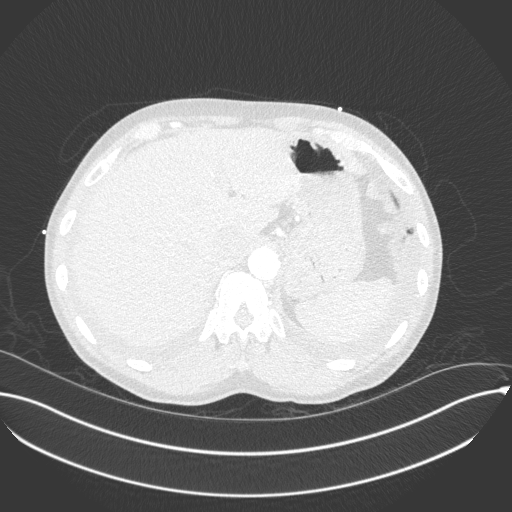
[im 53/178  lung]
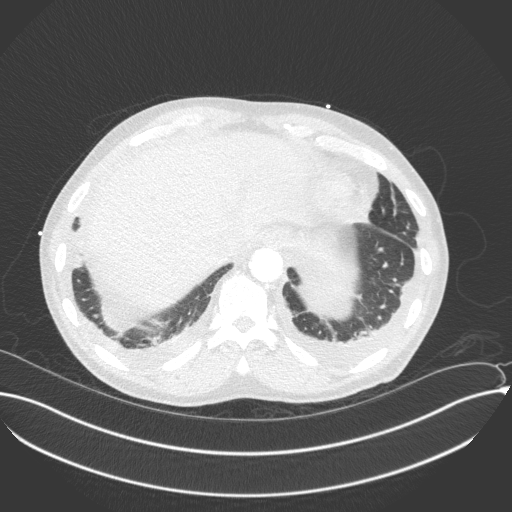
[im 66/178  mediastinal]
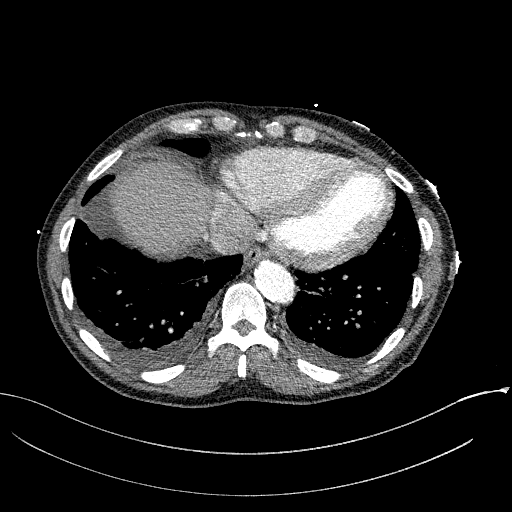
[im 66/178  lung]
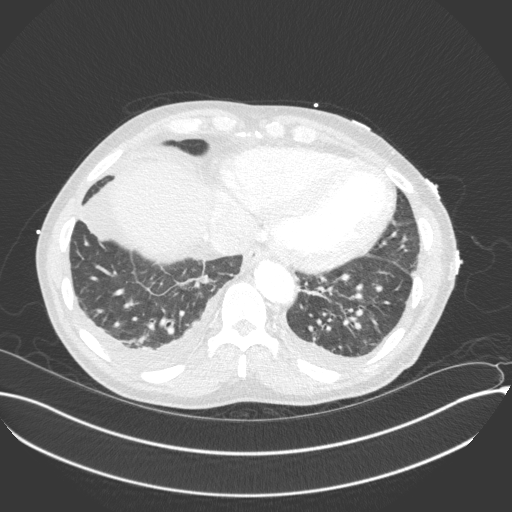
[im 79/178  lung]
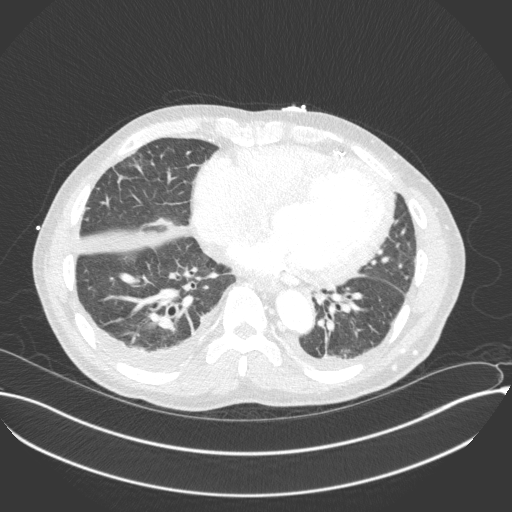
[im 99/178  lung]
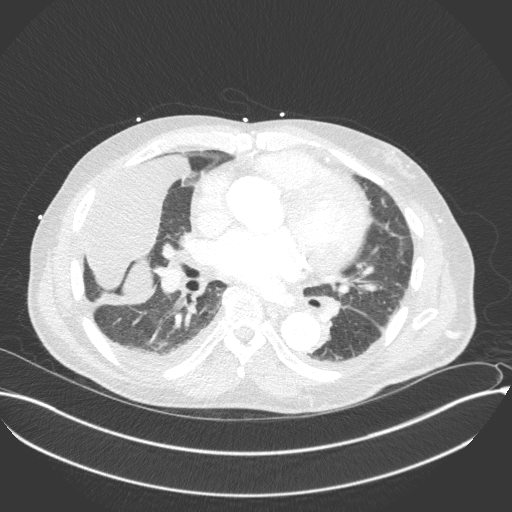
[im 112/178  lung]
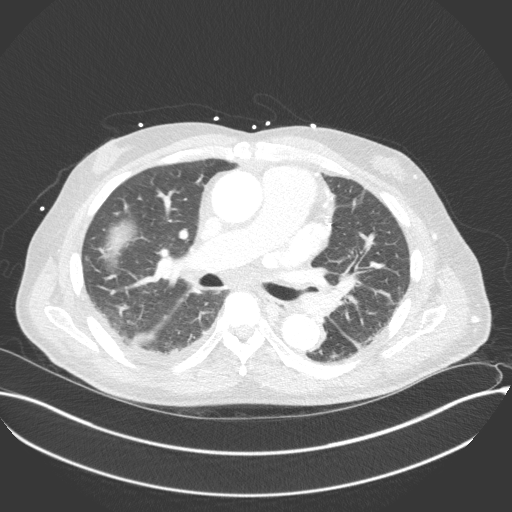
[im 125/178  mediastinal]
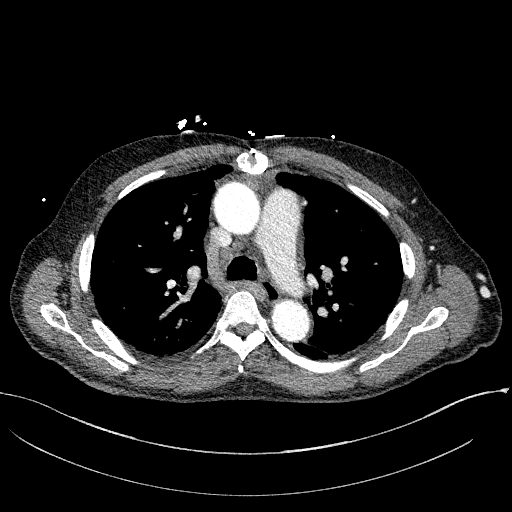
[im 125/178  lung]
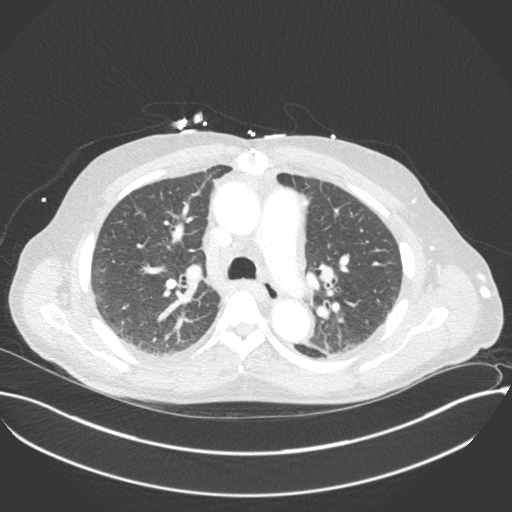
[im 138/178  lung]
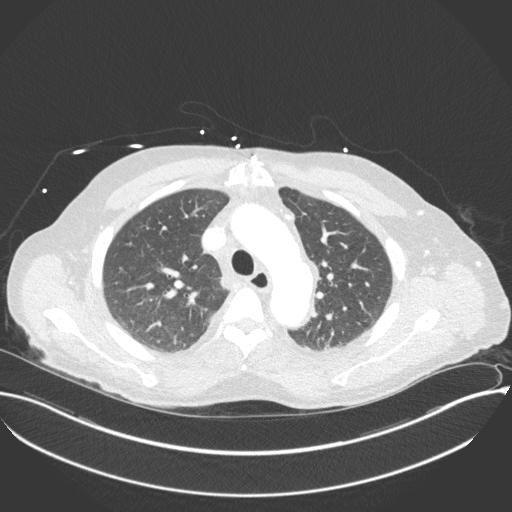
[im 151/178  lung]
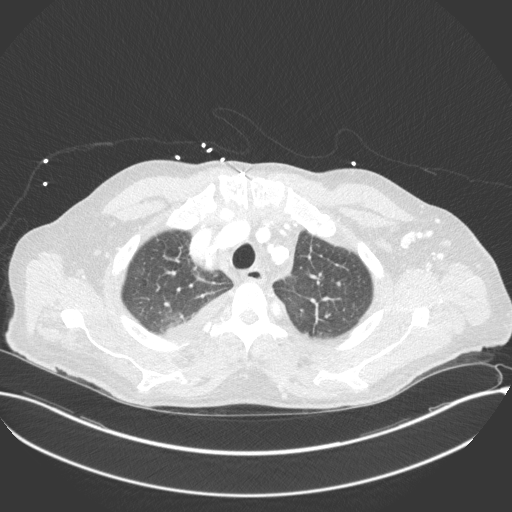
[im 164/178  lung]
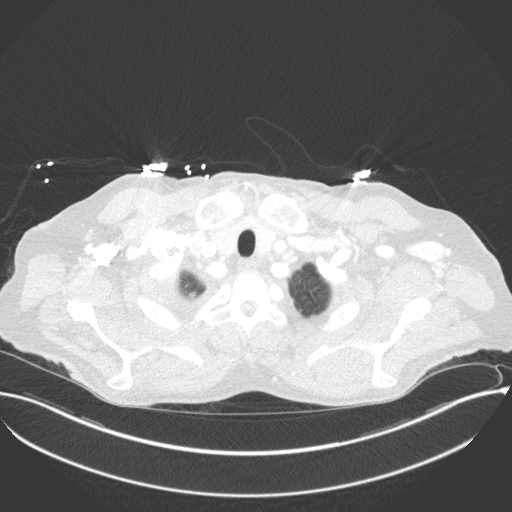

[Series 5: chest with 3mm st cor · coronal · 0.70mm/px · 3 of 90 slices shown]
[im 18/90  lung]
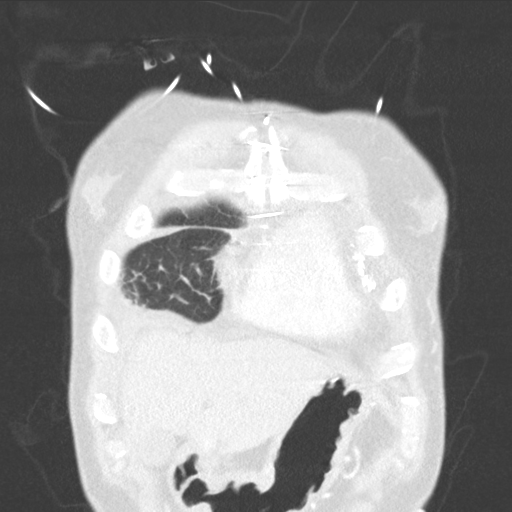
[im 36/90  lung]
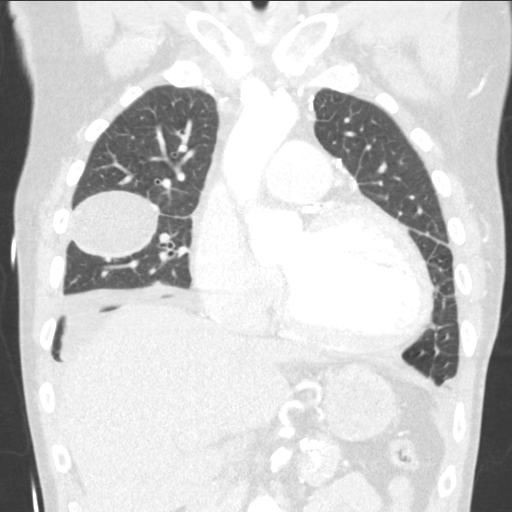
[im 54/90  lung]
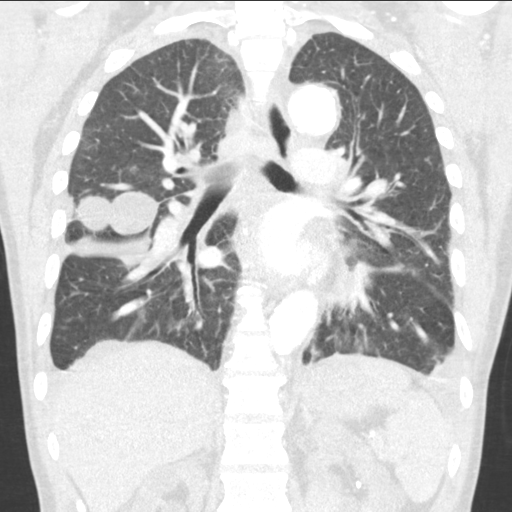

[15 of 36 positions shown; findings below may reference images not displayed]

FINDINGS: Cardiovascular: Advanced aortic atherosclerosis and tortuosity.
Atherosclerosis of major branch vessels. Stent in the right proximal
common carotid artery is patent. No dissection. No aneurysm. Post
CABG with dense calcifications of the native coronary arteries. Mild
cardiomegaly. No pericardial effusion.

Mediastinum/Nodes: Shotty mediastinal and right hilar nodes. For
example, lower paratracheal node measures 11 mm short axis.
Subcarinal node measures 12 mm. Right hilar node measures 12 mm. The
esophagus is decompressed. Possible low-density in the right lobe of
the thyroid gland versus artifact from adjacent carotid stent.

Lungs/Pleura: There is a loculated right pleural effusion, fluid
volume is moderate in degree. Loculated fluid in both the minor and
major fissure. Small amount of dependent pleural fluid as well as
pleural fluid in the upper right hemithorax. All fluid measures
simple fluid density. There is a trace dependent left pleural
effusion. No fluid in the left fissure. Moderate septal thickening
consistent pulmonary edema. Scattered compressive atelectasis
adjacent to pleural effusions. There is bronchial thickening,
greatest in the lower lobes. No consolidation to suggest pneumonia.

Upper Abdomen: Dense atherosclerosis of upper abdominal vasculature.
Polycystic kidney disease partially included.

Musculoskeletal: There are no acute or suspicious osseous
abnormalities. Degenerative change at C7-T1 is partially included
with Modic changes. Post median sternotomy with sternal nonunion.
Upper most sternal wire is broken, remaining sternal wires are
intact.
IMPRESSION: 1. Loculated fluid in the major and minor fissure on the right.
Small probable lower lobe bronchial thickening. Scattered
atelectasis adjacent pleural effusions. No evidence of pneumonia.
Free-flowing pleural effusions bilaterally, right greater than left.
Moderate septal thickening consistent with pulmonary edema. In
conjunction with cardiomegaly, consistent with fluid overload/CHF.
2. Shotty mediastinal and right hilar nodes are likely reactive
secondary CHF.
3. Advanced aortic atherosclerosis. Coronary artery calcifications
post CABG.

Aortic Atherosclerosis ([id number]).

## 2019-08-11 DEATH — deceased

## 2019-09-08 DEATH — deceased
